# Patient Record
Sex: Female | Born: 1960 | Race: Black or African American | Hispanic: No | Marital: Single | Smoking: Never smoker
Health system: Southern US, Community
[De-identification: ages and names within clinical notes are randomized; demographics above are authoritative.]

## PROBLEM LIST (undated history)

## (undated) DIAGNOSIS — G35D Multiple sclerosis, unspecified: Secondary | ICD-10-CM

## (undated) DIAGNOSIS — I10 Essential (primary) hypertension: Secondary | ICD-10-CM

## (undated) DIAGNOSIS — G35 Multiple sclerosis: Secondary | ICD-10-CM

## (undated) HISTORY — DX: Multiple sclerosis: G35

## (undated) HISTORY — DX: Multiple sclerosis, unspecified: G35.D

## (undated) HISTORY — DX: Essential (primary) hypertension: I10

## (undated) HISTORY — PX: CHOLECYSTECTOMY: SHX55

## (undated) HISTORY — PX: ABDOMINAL HYSTERECTOMY: SHX81

---

## 2021-09-30 ENCOUNTER — Emergency Department (HOSPITAL_BASED_OUTPATIENT_CLINIC_OR_DEPARTMENT_OTHER)
Admission: EM | Admit: 2021-09-30 | Discharge: 2021-09-30 | Disposition: A | Discharge status: Home / Self Care | Payer: Medicare Other | Attending: Emergency Medicine | Admitting: Emergency Medicine

## 2021-09-30 ENCOUNTER — Emergency Department (HOSPITAL_BASED_OUTPATIENT_CLINIC_OR_DEPARTMENT_OTHER): Payer: Medicare Other

## 2021-09-30 ENCOUNTER — Other Ambulatory Visit: Payer: Self-pay

## 2021-09-30 DIAGNOSIS — M79604 Pain in right leg: Secondary | ICD-10-CM

## 2021-09-30 DIAGNOSIS — M79661 Pain in right lower leg: Secondary | ICD-10-CM | POA: Diagnosis not present

## 2021-09-30 DIAGNOSIS — R1031 Right lower quadrant pain: Secondary | ICD-10-CM | POA: Diagnosis not present

## 2021-09-30 LAB — URINALYSIS, ROUTINE W REFLEX MICROSCOPIC
Bilirubin Urine: NEGATIVE
Glucose, UA: NEGATIVE mg/dL
Hgb urine dipstick: NEGATIVE
Ketones, ur: NEGATIVE mg/dL
Leukocytes,Ua: NEGATIVE
Nitrite: NEGATIVE
Protein, ur: NEGATIVE mg/dL
Specific Gravity, Urine: 1.022 (ref 1.005–1.030)
pH: 6 (ref 5.0–8.0)

## 2021-09-30 LAB — CBC WITH DIFFERENTIAL/PLATELET
Abs Immature Granulocytes: 0.03 10*3/uL (ref 0.00–0.07)
Basophils Absolute: 0.1 10*3/uL (ref 0.0–0.1)
Basophils Relative: 1 %
Eosinophils Absolute: 0.1 10*3/uL (ref 0.0–0.5)
Eosinophils Relative: 1 %
HCT: 42.9 % (ref 36.0–46.0)
Hemoglobin: 13.8 g/dL (ref 12.0–15.0)
Immature Granulocytes: 0 %
Lymphocytes Relative: 22 %
Lymphs Abs: 2.3 10*3/uL (ref 0.7–4.0)
MCH: 26.8 pg (ref 26.0–34.0)
MCHC: 32.2 g/dL (ref 30.0–36.0)
MCV: 83.5 fL (ref 80.0–100.0)
Monocytes Absolute: 1 10*3/uL (ref 0.1–1.0)
Monocytes Relative: 10 %
Neutro Abs: 7 10*3/uL (ref 1.7–7.7)
Neutrophils Relative %: 66 %
Platelets: 333 10*3/uL (ref 150–400)
RBC: 5.14 MIL/uL — ABNORMAL HIGH (ref 3.87–5.11)
RDW: 14.3 % (ref 11.5–15.5)
WBC: 10.5 10*3/uL (ref 4.0–10.5)
nRBC: 0 % (ref 0.0–0.2)

## 2021-09-30 LAB — COMPREHENSIVE METABOLIC PANEL
ALT: 9 U/L (ref 0–44)
AST: 14 U/L — ABNORMAL LOW (ref 15–41)
Albumin: 4.2 g/dL (ref 3.5–5.0)
Alkaline Phosphatase: 79 U/L (ref 38–126)
Anion gap: 10 (ref 5–15)
BUN: 17 mg/dL (ref 8–23)
CO2: 25 mmol/L (ref 22–32)
Calcium: 9.9 mg/dL (ref 8.9–10.3)
Chloride: 106 mmol/L (ref 98–111)
Creatinine, Ser: 0.89 mg/dL (ref 0.44–1.00)
GFR, Estimated: >60 mL/min (ref >60)
Glucose, Bld: 81 mg/dL (ref 70–99)
Potassium: 3.7 mmol/L (ref 3.5–5.1)
Sodium: 141 mmol/L (ref 135–145)
Total Bilirubin: 0.4 mg/dL (ref 0.3–1.2)
Total Protein: 7.2 g/dL (ref 6.5–8.1)

## 2021-09-30 LAB — LIPASE, BLOOD: Lipase: 11 U/L (ref 11–51)

## 2021-09-30 MED ORDER — KETOROLAC TROMETHAMINE 30 MG/ML IJ SOLN
30.0000 mg | Freq: Once | INTRAMUSCULAR | Status: AC
  Administered 2021-09-30: 30 mg via INTRAVENOUS
  Filled 2021-09-30: qty 1

## 2021-09-30 NOTE — ED Triage Notes (Signed)
Pt arrives to ED with c/o of lower abdominal pain x1 day and right sided groin pain x1 week. The groin pain is right sided, intermittent, and sharp. Advil 600mg  helps relieve this pain. The abdominal pain is constant and dull. She reports she is urinating more. Denies dysuria, hematuria. No N/V/D/Constipation. No vaginal bleeding or abnormal discharge.

## 2021-09-30 NOTE — Discharge Instructions (Addendum)
Overall suspect that your pain is muscular in nature.  Recommend 400 mg ibuprofen every 8 hours as needed for the next 5 days in addition to 1000 mg of Tylenol every 6 hours as needed for the next 5 days.  Follow-up with primary care doctor and continue to work with physical therapy.

## 2021-09-30 NOTE — ED Provider Notes (Signed)
[facility] EMERGENCY DEPT Provider Note   CSN: [id number] Arrival date & time: 09/30/21  1342     History Chief Complaint  Patient presents with   Abdominal Pain    Lauren Curry is a 60 y.o. female.   Abdominal Pain Pain location:  RLQ Pain quality: aching   Pain radiates to:  Does not radiate Pain severity:  Mild Onset quality:  Gradual Timing:  Intermittent Progression:  Waxing and waning Chronicity:  New Context comment:  Pain in the right groin, right lower abdomen last several days.  Recently restarted physical therapy.  History of MS. Relieved by:  Nothing Worsened by:  Nothing Associated symptoms: no chest pain, no chills, no cough, no dysuria, no fever, no hematuria, no shortness of breath, no sore throat and no vomiting       No past medical history on file.  There are no problems to display for this patient.    OB History   No obstetric history on file.     No family history on file.     Home Medications Prior to Admission medications   Not on File    Allergies    Patient has no known allergies.  Review of Systems   Review of Systems  Constitutional:  Negative for chills and fever.  HENT:  Negative for ear pain and sore throat.   Eyes:  Negative for pain and visual disturbance.  Respiratory:  Negative for cough and shortness of breath.   Cardiovascular:  Negative for chest pain and palpitations.  Gastrointestinal:  Positive for abdominal pain. Negative for vomiting.  Genitourinary:  Negative for dysuria and hematuria.  Musculoskeletal:  Positive for gait problem. Negative for arthralgias and back pain.  Skin:  Negative for color change and rash.  Neurological:  Negative for seizures and syncope.  All other systems reviewed and are negative.  Physical Exam Updated Vital Signs BP (!) 190/96   Pulse 64   Temp 98.1 F (36.7 C) (Oral)   Resp 18   Ht 5\' 5"  (1.651 m)   Wt (!) 142.9 kg   SpO2 100%   BMI 52.42 kg/m    Physical Exam Vitals and nursing note reviewed.  Constitutional:      General: She is not in acute distress.    Appearance: She is well-developed.  HENT:     Head: Normocephalic and atraumatic.  Eyes:     Conjunctiva/sclera: Conjunctivae normal.  Cardiovascular:     Rate and Rhythm: Normal rate and regular rhythm.     Pulses: Normal pulses.          Dorsalis pedis pulses are 2+ on the right side and 2+ on the left side.     Heart sounds: No murmur heard. Pulmonary:     Effort: Pulmonary effort is normal. No respiratory distress.     Breath sounds: Normal breath sounds.  Abdominal:     General: Abdomen is flat.     Palpations: Abdomen is soft.     Tenderness: There is abdominal tenderness in the right lower quadrant. There is no guarding. Negative signs include Murphy's sign.     Hernia: No hernia is present.  Musculoskeletal:     Cervical back: Neck supple.     Comments: Tenderness in the right hip flexor area, pain when extending at her right hip  Skin:    General: Skin is warm and dry.     Capillary Refill: Capillary refill takes less than 2 seconds.  Neurological:  General: No focal deficit present.     Mental Status: She is alert.    ED Results / Procedures / Treatments   Labs (all labs ordered are listed, but only abnormal results are displayed) Labs Reviewed  CBC WITH DIFFERENTIAL/PLATELET - Abnormal; Notable for the following components:      Result Value   RBC 5.14 (*)    All other components within normal limits  COMPREHENSIVE METABOLIC PANEL - Abnormal; Notable for the following components:   AST 14 (*)    All other components within normal limits  LIPASE, BLOOD  URINALYSIS, ROUTINE W REFLEX MICROSCOPIC    EKG None  Radiology CT Renal Stone Study  Result Date: 09/30/2021 CLINICAL DATA:  Lower abdominal pain, right-sided groin pain EXAM: CT ABDOMEN AND PELVIS WITHOUT CONTRAST TECHNIQUE: Multidetector CT imaging of the abdomen and pelvis was  performed following the standard protocol without IV contrast. COMPARISON:  None. FINDINGS: Lower chest: There is mild dependent subsegmental atelectasis in the right lung base. The imaged heart is unremarkable. Hepatobiliary: The gallbladder is surgically absent. The liver is unremarkable. There is no biliary ductal dilatation. Pancreas: The pancreas demonstrates diffuse fatty atrophy. The pancreatic head appears edematous with mild haziness in the surrounding fat. There is no peripancreatic fluid collection. There is no main pancreatic ductal dilatation or focal parenchymal lesion. Spleen: Unremarkable. Adrenals/Urinary Tract: The adrenals are unremarkable. The kidneys are unremarkable. There are no focal lesions or stones. There is no hydronephrosis or hydroureter. The bladder is unremarkable. Stomach/Bowel: The stomach is decompressed but grossly unremarkable. There is no evidence of bowel obstruction. There is no abnormal bowel wall thickening or inflammatory change. There are scattered colonic diverticula without evidence of acute diverticulitis. The appendix is normal. Vascular/Lymphatic: The abdominal aorta is nonaneurysmal. There is no pathologic lymphadenopathy in the abdomen or pelvis. Reproductive: The uterus is not identified, presumed surgically absent. There is no adnexal mass. Other: There is no ascites or free air. Musculoskeletal: There is no acute osseous abnormality or aggressive osseous lesion. There is mild multilevel degenerative change of the spine. IMPRESSION: 1. Findings above suspicious for mild acute interstitial pancreatitis. Correlate with symptoms and lab values. 2. No other acute findings in the abdomen or pelvis. Specifically, no renal stones or hydronephrosis. Electronically Signed   By: Lesia Hausen M.D.   On: 09/30/2021 16:25    Procedures Procedures   Medications Ordered in ED Medications  ketorolac (TORADOL) 30 MG/ML injection 30 mg (30 mg Intravenous Given 09/30/21 1729)     ED Course  I have reviewed the triage vital signs and the nursing notes.  Pertinent labs & imaging results that were available during my care of the patient were reviewed by me and considered in my medical decision making (see chart for details).    MDM Rules/Calculators/A&P                           Tarahji Ramthun is here with right lower abdominal pain, right groin pain.  History of MS.  Vital signs overall unremarkable.  Blood pressure mildly elevated.  Recently restarted physical therapy.  Having pain when she flexes at her right hip.  Denies any trauma or falls.  Lab work showed no significant anemia, electrolyte Abdelhai, kidney injury.  No leukocytosis.  Lipase is normal.  CT scan was done that was overall unremarkable.  Thought maybe there could be some pancreatitis but lipase is normal.  History and physical is not consistent with  pancreatitis as well.  She is not having any trouble eating or drinking.  She does not have a hernia.  No urinary tract infection.  Pain appears to be mostly reproducible especially with movement of the right hip.  No fracture of the hip is seen.  Recommend anti-inflammatories and Tylenol.  Recommend follow-up with primary care doctor.  Recommend continued work with physical therapy as well.  Neurovascular neuromuscularly intact otherwise.  This chart was dictated using voice recognition software.  Despite best efforts to proofread,  errors can occur which can change the documentation meaning.    Final Clinical Impression(s) / ED Diagnoses Final diagnoses:  Pain of right lower extremity    Rx / DC Orders ED Discharge Orders     None        Virgina Norfolk, DO 09/30/21 1756

## 2021-09-30 NOTE — ED Notes (Signed)
Patient verbalizes understanding of discharge instructions. Opportunity for questioning and answers were provided. Patient discharged from ED.  °

## 2021-11-05 ENCOUNTER — Ambulatory Visit: Payer: Medicare Other

## 2021-11-19 ENCOUNTER — Ambulatory Visit: Payer: Medicare Other | Attending: Neurology

## 2022-01-06 ENCOUNTER — Telehealth: Payer: Medicare Other | Admitting: Physician Assistant

## 2022-01-06 DIAGNOSIS — U071 COVID-19: Secondary | ICD-10-CM

## 2022-01-06 MED ORDER — BENZONATATE 100 MG PO CAPS: 100.0000 mg | ORAL_CAPSULE | Freq: Three times a day (TID) | ORAL | 0 refills | Status: DC | PRN

## 2022-01-06 NOTE — Patient Instructions (Signed)
°  [facility], thank you for joining Margaretann Loveless, PA-C for today's virtual visit.  While this provider is not your primary care provider (PCP), if your PCP is located in our provider database this encounter information will be shared with them immediately following your visit.  Consent: (Patient) Lauren Curry provided verbal consent for this virtual visit at the beginning of the encounter.  Current Medications:  Current Outpatient Medications:    benzonatate (TESSALON) 100 MG capsule, Take 1 capsule (100 mg total) by mouth 3 (three) times daily as needed., Disp: 30 capsule, Rfl: 0   Medications ordered in this encounter:  Meds ordered this encounter  Medications   benzonatate (TESSALON) 100 MG capsule    Sig: Take 1 capsule (100 mg total) by mouth 3 (three) times daily as needed.    Dispense:  30 capsule    Refill:  0    Order Specific Question:   Supervising Provider    Answer:   Hyacinth Meeker, BRIAN [[id number]]     *If you need refills on other medications prior to your next appointment, please contact your pharmacy*  Follow-Up: Call back or seek an in-person evaluation if the symptoms worsen or if the condition fails to improve as anticipated.  Other Instructions 10 Things You Can Do to Manage Your COVID-19 Symptoms at Home If you have possible or confirmed COVID-19 Stay home except to get medical care. Monitor your symptoms carefully. If your symptoms get worse, call your healthcare provider immediately. Get rest and stay hydrated. If you have a medical appointment, call the healthcare provider ahead of time and tell them that you have or may have COVID-19. For medical emergencies, call 911 and notify the dispatch personnel that you have or may have COVID-19. Cover your cough and sneezes with a tissue or use the inside of your elbow. Wash your hands often with soap and water for at least 20 seconds or clean your hands with an alcohol-based hand sanitizer that contains at  least 60% alcohol. As much as possible, stay in a specific room and away from other people in your home. Also, you should use a separate bathroom, if available. If you need to be around other people in or outside of the home, wear a mask. Avoid sharing personal items with other people in your household, like dishes, towels, and bedding. Clean all surfaces that are touched often, like counters, tabletops, and doorknobs. Use household cleaning sprays or wipes according to the label instructions. [email or web address] 07/06/2020 This information is not intended to replace advice given to you by your health care provider. Make sure you discuss any questions you have with your health care provider. Document Revised: 08/30/2021 Document Reviewed: 08/30/2021 Elsevier Patient Education  2022 [employer].    If you have been instructed to have an in-person evaluation today at a local Urgent Care facility, please use the link below. It will take you to a list of all of our available [facility], including address, phone number and hours of operation. Please do not delay care.  [facility]  If you or a family member do not have a primary care provider, use the link below to schedule a visit and establish care. When you choose a [facility] primary care physician or advanced practice provider, you gain a long-term partner in health. Find a Primary Care Provider  Learn more about [facility] in-office and virtual care options: [facility] - Get Care Now

## 2022-01-06 NOTE — Progress Notes (Signed)
Virtual Visit Consent   Lauren Curry, you are scheduled for a virtual visit with a [facility] provider today.     Just as with appointments in the office, your consent must be obtained to participate.  Your consent will be active for this visit and any virtual visit you may have with one of our providers in the next 365 days.     If you have a MyChart account, a copy of this consent can be sent to you electronically.  All virtual visits are billed to your insurance company just like a traditional visit in the office.    As this is a virtual visit, video technology does not allow for your provider to perform a traditional examination.  This may limit your provider's ability to fully assess your condition.  If your provider identifies any concerns that need to be evaluated in person or the need to arrange testing (such as labs, EKG, etc.), we will make arrangements to do so.     Although advances in technology are sophisticated, we cannot ensure that it will always work on either your end or our end.  If the connection with a video visit is poor, the visit may have to be switched to a telephone visit.  With either a video or telephone visit, we are not always able to ensure that we have a secure connection.     I need to obtain your verbal consent now.   Are you willing to proceed with your visit today?    Lauren Curry has provided verbal consent on 01/06/2022 for a virtual visit (video or telephone).   Margaretann Loveless, PA-C   Date: 01/06/2022 9:19 AM   Virtual Visit via Video Note   I, Margaretann Loveless, connected with  Lauren Curry  ([id number], 08-25-1961) on 01/06/22 at  9:15 AM EST by a video-enabled telemedicine application and verified that I am speaking with the correct person using two identifiers.   Location: Patient: Virtual Visit Location Patient: Home Provider: Virtual Visit Location Provider: Home Office   I discussed the limitations of evaluation and management  by telemedicine and the availability of in person appointments. The patient expressed understanding and agreed to proceed.    History of Present Illness: Lauren Curry is a 61 y.o. who identifies as a female who was assigned female at birth, and is being seen today for covid 69.  HPI: URI  This is a new problem. Episode onset: tested positive for Covid 19 yesterday; Symptoms started last Tuesday possibly, had cold symptoms. Then Saturday she noticed weakness and decided to take a Covid. The problem has been gradually worsening. There has been no fever. Associated symptoms include congestion, coughing (productive), headaches, nausea, rhinorrhea and a sore throat. Pertinent negatives include no diarrhea, ear pain, plugged ear sensation, sinus pain or vomiting. Associated symptoms comments: Weakness, fatigue, post nasal drainage. She has tried acetaminophen, increased fluids and sleep for the symptoms. The treatment provided no relief.     Problems: There are no problems to display for this patient.   Allergies: No Known Allergies Medications:  Current Outpatient Medications:    benzonatate (TESSALON) 100 MG capsule, Take 1 capsule (100 mg total) by mouth 3 (three) times daily as needed., Disp: 30 capsule, Rfl: 0  Observations/Objective: Patient is well-developed, well-nourished in no acute distress.  Resting comfortably at home.  Head is normocephalic, atraumatic.  No labored breathing.  Speech is clear and coherent with logical content.  Patient is alert and oriented  at baseline.    Assessment and Plan: 1. COVID-19 - benzonatate (TESSALON) 100 MG capsule; Take 1 capsule (100 mg total) by mouth 3 (three) times daily as needed.  Dispense: 30 capsule; Refill: 0  - Continue OTC symptomatic management of choice; on day 6 of symptoms - Will send OTC vitamins and supplement information through AVS - Tessalon prescribed - Patient enrolled in MyChart symptom monitoring - Push fluids - Rest  as needed - Discussed return precautions and when to seek in-person evaluation, sent via AVS as well   Follow Up Instructions: I discussed the assessment and treatment plan with the patient. The patient was provided an opportunity to ask questions and all were answered. The patient agreed with the plan and demonstrated an understanding of the instructions.  A copy of instructions were sent to the patient via MyChart unless otherwise noted below.    The patient was advised to call back or seek an in-person evaluation if the symptoms worsen or if the condition fails to improve as anticipated.  Time:  I spent 11 minutes with the patient via telehealth technology discussing the above problems/concerns.    Margaretann Loveless, PA-C

## 2022-02-12 NOTE — Progress Notes (Signed)
Cardiology Office Note:   Date:  02/13/2022  NAME:  Lauren Curry    MRN: [id number] DOB:  [birth year]   PCP:  Pcp, No  Cardiologist:  None  Electrophysiologist:  None   Referring MD: Mallory Shirk, MD   Chief Complaint  Patient presents with   Pre-op Exam         History of Present Illness:   Lauren Curry is a 61 y.o. female with a hx of HTN, obesity who is being seen today for the evaluation of preoperative assessment at the request of Enochs, Algis Downs, MD. she reports he will have gastric bypass surgery in [facility].  Surgeon as mentioned above.  She has no heart history.  Her father is Mr. Beverely Pace.  She has multiple sclerosis.  She also has morbid obesity with a BMI of 300 pounds.  She apparently is largely bedridden from what she tells me.  She does not do any activity.  She is wheelchair-bound.  She can get up to go to the bathroom and that is about it.  She reports no chest pain or trouble breathing.  She did have swelling in the past but has been taken off Lasix.  Medical history is significant for hypertension.  She has never had a heart attack or stroke.  Family history is significant for CAD in her father.  She is married.  She has 3 children.  Relocating back to the [place] area.  Big issue is inactivity.  EKG demonstrates sinus rhythm with nonspecific ST-T changes.  She has poor R wave progression which I suspect is related to morbid obesity.  Denies any symptoms but cannot complete 4 METS.  She is a never smoker.  She does not alcohol.  No drug use is reported.  T chol 210, HDL 64, LDL 133, TG 75 A1c 5.6  Past Medical History: Past Medical History:  Diagnosis Date   Hypertension    Multiple sclerosis (HCC)     Past Surgical History: Past Surgical History:  Procedure Laterality Date   ABDOMINAL HYSTERECTOMY     CHOLECYSTECTOMY      Current Medications: Current Meds  Medication Sig   aspirin 81 MG EC tablet Take 1 tablet by mouth daily.   KESIMPTA  20 MG/0.4ML SOAJ Inject into the skin every 30 (thirty) days.   triamterene-hydrochlorothiazide (DYAZIDE) 37.5-25 MG capsule Take 1 capsule by mouth daily.   [DISCONTINUED] benzonatate (TESSALON) 100 MG capsule Take 1 capsule (100 mg total) by mouth 3 (three) times daily as needed.     Allergies:    Patient has no known allergies.   Social History: Social History   Socioeconomic History   Marital status: Single    Spouse name: Not on file   Number of children: 3   Years of education: Not on file   Highest education level: Not on file  Occupational History   Occupation: Disabled  Tobacco Use   Smoking status: Never   Smokeless tobacco: Never  Substance and Sexual Activity   Alcohol use: Never   Drug use: Never   Sexual activity: Never  Other Topics Concern   Not on file  Social History Narrative   Not on file   Social Determinants of Health   Financial Resource Strain: Not on file  Food Insecurity: Not on file  Transportation Needs: Not on file  Physical Activity: Not on file  Stress: Not on file  Social Connections: Not on file     Family History: The  patient's family history includes CAD in her father.  ROS:   All other ROS reviewed and negative. Pertinent positives noted in the HPI.     EKGs/Labs/Other Studies Reviewed:   The following studies were personally reviewed by me today:  EKG:  EKG is ordered today.  The ekg ordered today demonstrates normal sinus rhythm heart rate 90, nonspecific ST-T changes, and was personally reviewed by me.   Recent Labs: 09/30/2021: ALT 9; BUN 17; Creatinine, Ser 0.89; Hemoglobin 13.8; Platelets 333; Potassium 3.7; Sodium 141   Recent Lipid Panel No results found for: CHOL, TRIG, HDL, CHOLHDL, VLDL, LDLCALC, LDLDIRECT  Physical Exam:   VS:  BP 132/88 (BP Location: Right Arm, Patient Position: Sitting, Cuff Size: Large)    Pulse 90    Ht 5\' 3"  (1.6 m)    Wt 300 lb (136.1 kg)    BMI 53.14 kg/m    Wt Readings from Last 3  Encounters:  02/13/22 300 lb (136.1 kg)  09/30/21 (!) 315 lb (142.9 kg)    General: Well nourished, well developed, in no acute distress Head: Atraumatic, normal size  Eyes: PEERLA, EOMI  Neck: Supple, no JVD Endocrine: No thryomegaly Cardiac: Normal S1, S2; RRR; no murmurs, rubs, or gallops Lungs: Clear to auscultation bilaterally, no wheezing, rhonchi or rales  Abd: Soft, nontender, no hepatomegaly  Ext: No edema, pulses 2+ Musculoskeletal: No deformities, BUE and BLE strength normal and equal Skin: Warm and dry, no rashes   Neuro: Alert and oriented to person, place, time, and situation, CNII-XII grossly intact, no focal deficits  Psych: Normal mood and affect   ASSESSMENT:   Lauren Curry is a 61 y.o. female who presents for the following: 1. Preop cardiovascular exam   2. Obesity, morbid, BMI 50 or higher (HCC)     PLAN:   1. Preop cardiovascular exam 2. Obesity, morbid, BMI 50 or higher (HCC) -Preoperative assessment for gastric bypass surgery.  EKG demonstrates sinus rhythm with no acute ischemic changes.  She does have poor R wave progression likely due to obesity.  Her RCRI equals 0 which equates to a 0.4% risk of major perioperative cardiovascular complication.  She does have multiple sclerosis and is basically wheelchair-bound.  She cannot complete greater than 4 METS.  Despite this she does not report chest pain or trouble breathing.  Given that she cannot complete greater than 4 METS and her father has CAD I think it is reasonable to proceed with a Lexiscan nuclear medicine stress test.  This will exclude CAD.  I think this will be the best option to safely say she is okay for surgery.  I will see her back as needed based on the results of her scan.  Shared Decision Making/Informed Consent The risks [chest pain, shortness of breath, cardiac arrhythmias, dizziness, blood pressure fluctuations, myocardial infarction, stroke/transient ischemic attack, nausea, vomiting,  allergic reaction, radiation exposure, metallic taste sensation and life-threatening complications (estimated to be 1 in 10,000)], benefits (risk stratification, diagnosing coronary artery disease, treatment guidance) and alternatives of a nuclear stress test were discussed in detail with Lauren Curry and she agrees to proceed.  Disposition: Return if symptoms worsen or fail to improve.  Medication Adjustments/Labs and Tests Ordered: Current medicines are reviewed at length with the patient today.  Concerns regarding medicines are outlined above.  Orders Placed This Encounter  Procedures   Cardiac Stress Test: Informed Consent Details: Physician/Practitioner Attestation; Transcribe to consent form and obtain patient signature   MYOCARDIAL PERFUSION IMAGING  EKG 12-Lead   No orders of the defined types were placed in this encounter.   Patient Instructions  Medication Instructions:  The current medical regimen is effective;  continue present plan and medications.  *If you need a refill on your cardiac medications before your next appointment, please call your pharmacy*  Testing/Procedures: Your physician has requested that you have a lexiscan myoview. For further information please visit [email or web address] Please follow instruction sheet, as given.   Follow-Up: At [facility], you and your health needs are our priority.  As part of our continuing mission to provide you with exceptional heart care, we have created designated Provider Care Teams.  These Care Teams include your primary Cardiologist (physician) and Advanced Practice Providers (APPs -  Physician Assistants and Nurse Practitioners) who all work together to provide you with the care you need, when you need it.  We recommend signing up for the patient portal called "MyChart".  Sign up information is provided on this After Visit Summary.  MyChart is used to connect with patients for Virtual Visits (Telemedicine).  Patients are  able to view lab/test results, encounter notes, upcoming appointments, etc.  Non-urgent messages can be sent to your provider as well.   To learn more about what you can do with MyChart, go to [email or web address].    Your next appointment:   As needed  The format for your next appointment:   In Person  Provider:   Lennie Odor, MD      Signed, Lenna Gilford. Flora Lipps, MD, [facility]   [facility]  [address] [place], [place] [address] [phone]  02/13/2022 2:35 PM

## 2022-02-13 ENCOUNTER — Encounter: Payer: Self-pay | Admitting: Cardiovascular Disease

## 2022-02-13 ENCOUNTER — Other Ambulatory Visit: Payer: Self-pay

## 2022-02-13 ENCOUNTER — Ambulatory Visit (INDEPENDENT_AMBULATORY_CARE_PROVIDER_SITE_OTHER): Payer: Medicare Other | Admitting: Cardiovascular Disease

## 2022-02-13 VITALS — BP 132/88 | HR 90 | Ht 63.0 in | Wt 300.0 lb

## 2022-02-13 DIAGNOSIS — Z01810 Encounter for preprocedural cardiovascular examination: Secondary | ICD-10-CM | POA: Diagnosis not present

## 2022-02-13 NOTE — Patient Instructions (Signed)
Medication Instructions:  The current medical regimen is effective;  continue present plan and medications.  *If you need a refill on your cardiac medications before your next appointment, please call your pharmacy*  Testing/Procedures: Your physician has requested that you have a lexiscan myoview. For further information please visit [email or web address] Please follow instruction sheet, as given.   Follow-Up: At [facility], you and your health needs are our priority.  As part of our continuing mission to provide you with exceptional heart care, we have created designated Provider Care Teams.  These Care Teams include your primary Cardiologist (physician) and Advanced Practice Providers (APPs -  Physician Assistants and Nurse Practitioners) who all work together to provide you with the care you need, when you need it.  We recommend signing up for the patient portal called "MyChart".  Sign up information is provided on this After Visit Summary.  MyChart is used to connect with patients for Virtual Visits (Telemedicine).  Patients are able to view lab/test results, encounter notes, upcoming appointments, etc.  Non-urgent messages can be sent to your provider as well.   To learn more about what you can do with MyChart, go to [email or web address].    Your next appointment:   As needed  The format for your next appointment:   In Person  Provider:   Lennie Odor, MD

## 2022-02-14 ENCOUNTER — Telehealth (HOSPITAL_COMMUNITY): Payer: Self-pay | Admitting: *Deleted

## 2022-02-14 NOTE — Telephone Encounter (Signed)
Left message on voicemail per DPR in reference to upcoming appointment scheduled on 02/19/2022 at 10:30 with detailed instructions given per Myocardial Perfusion Study Information Sheet for the test. LM to arrive 15 minutes early, and that it is imperative to arrive on time for appointment to keep from having the test rescheduled. If you need to cancel or reschedule your appointment, please call the office within 24 hours of your appointment. Failure to do so may result in a cancellation of your appointment, and a $50 no show fee. Phone number given for call back for any questions.

## 2022-02-19 ENCOUNTER — Ambulatory Visit (HOSPITAL_COMMUNITY): Payer: Medicare Other | Attending: Cardiovascular Disease

## 2022-02-19 ENCOUNTER — Other Ambulatory Visit: Payer: Self-pay

## 2022-02-19 DIAGNOSIS — Z01810 Encounter for preprocedural cardiovascular examination: Secondary | ICD-10-CM | POA: Diagnosis present

## 2022-02-19 MED ORDER — TECHNETIUM TC 99M TETROFOSMIN IV KIT
32.4000 | PACK | Freq: Once | INTRAVENOUS | Status: AC | PRN
Start: 2022-02-19 — End: 2022-02-19
  Administered 2022-02-19: 32.4 via INTRAVENOUS
  Filled 2022-02-19: qty 33

## 2022-02-19 MED ORDER — REGADENOSON 0.4 MG/5ML IV SOLN
0.4000 mg | Freq: Once | INTRAVENOUS | Status: AC
  Administered 2022-02-19: 0.4 mg via INTRAVENOUS

## 2022-02-26 ENCOUNTER — Other Ambulatory Visit: Payer: Self-pay

## 2022-02-26 ENCOUNTER — Ambulatory Visit (HOSPITAL_COMMUNITY): Payer: Medicare Other | Attending: Internal Medicine

## 2022-02-26 LAB — MYOCARDIAL PERFUSION IMAGING
LV dias vol: 53 mL (ref 46–106)
LV sys vol: 16 mL
Nuc Stress EF: 70 %
Peak HR: 107 {beats}/min
Rest HR: 75 {beats}/min
Rest Nuclear Isotope Dose: 30.5 mCi
SDS: 2
SRS: 1
SSS: 3
ST Depression (mm): 0 mm
Stress Nuclear Isotope Dose: 32.4 mCi
TID: 1.35

## 2022-02-26 MED ORDER — TECHNETIUM TC 99M TETROFOSMIN IV KIT
30.5000 | PACK | Freq: Once | INTRAVENOUS | Status: AC | PRN
  Administered 2022-02-26: 30.5 via INTRAVENOUS
  Filled 2022-02-26: qty 31

## 2022-02-27 ENCOUNTER — Encounter: Payer: Self-pay | Admitting: Cardiovascular Disease

## 2022-09-26 ENCOUNTER — Other Ambulatory Visit (HOSPITAL_COMMUNITY): Payer: Self-pay | Admitting: Physician Assistant

## 2022-09-26 DIAGNOSIS — G35 Multiple sclerosis: Secondary | ICD-10-CM

## 2022-10-05 ENCOUNTER — Ambulatory Visit (HOSPITAL_COMMUNITY)
Admission: RE | Admit: 2022-10-05 | Discharge: 2022-10-05 | Disposition: A | Discharge status: Home / Self Care | Payer: Medicare Other | Source: Ambulatory Visit | Attending: Physician Assistant | Admitting: Physician Assistant

## 2022-10-05 DIAGNOSIS — G35 Multiple sclerosis: Secondary | ICD-10-CM

## 2022-10-05 MED ORDER — GADOBUTROL 1 MMOL/ML IV SOLN
10.0000 mL | Freq: Once | INTRAVENOUS | Status: AC | PRN
  Administered 2022-10-05: 10 mL via INTRAVENOUS

## 2023-07-13 ENCOUNTER — Ambulatory Visit: Payer: Medicare Other | Admitting: Physical Therapy

## 2023-07-13 ENCOUNTER — Telehealth: Payer: Medicare Other | Admitting: Nurse Practitioner

## 2023-07-13 DIAGNOSIS — U071 COVID-19: Secondary | ICD-10-CM

## 2023-07-13 MED ORDER — AZITHROMYCIN 250 MG PO TABS: ORAL_TABLET | ORAL | 0 refills | Status: AC

## 2023-07-13 MED ORDER — BENZONATATE 100 MG PO CAPS: 100.0000 mg | ORAL_CAPSULE | Freq: Three times a day (TID) | ORAL | 0 refills | Status: DC | PRN

## 2023-07-13 NOTE — Progress Notes (Signed)
Virtual Visit Consent   Lauren Curry, you are scheduled for a virtual visit with a [facility] provider today. Just as with appointments in the office, your consent must be obtained to participate. Your consent will be active for this visit and any virtual visit you may have with one of our providers in the next 365 days. If you have a MyChart account, a copy of this consent can be sent to you electronically.  As this is a virtual visit, video technology does not allow for your provider to perform a traditional examination. This may limit your provider's ability to fully assess your condition. If your provider identifies any concerns that need to be evaluated in person or the need to arrange testing (such as labs, EKG, etc.), we will make arrangements to do so. Although advances in technology are sophisticated, we cannot ensure that it will always work on either your end or our end. If the connection with a video visit is poor, the visit may have to be switched to a telephone visit. With either a video or telephone visit, we are not always able to ensure that we have a secure connection.  By engaging in this virtual visit, you consent to the provision of healthcare and authorize for your insurance to be billed (if applicable) for the services provided during this visit. Depending on your insurance coverage, you may receive a charge related to this service.  I need to obtain your verbal consent now. Are you willing to proceed with your visit today? Lauren Curry has provided verbal consent on 07/13/2023 for a virtual visit (video or telephone). Lauren Simas, FNP  Date: 07/13/2023 8:07 AM  Virtual Visit via Video Note   I, Lauren Curry, connected with  Lauren Curry  ([id number], 1961-07-13) on 07/13/23 at  8:15 AM EDT by a video-enabled telemedicine application and verified that I am speaking with the correct person using two identifiers.  Location: Patient: Virtual Visit Location Patient:  Home Provider: Virtual Visit Location Provider: Home Office   I discussed the limitations of evaluation and management by telemedicine and the availability of in person appointments. The patient expressed understanding and agreed to proceed.    History of Present Illness: Lauren Curry is a 62 y.o. 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   Allergies: No Known Allergies Medications:  Current Outpatient Medications:    aspirin 81 MG EC tablet, Take 1 tablet by mouth daily., Disp: , Rfl:    KESIMPTA 20 MG/0.4ML SOAJ, Inject into the skin every 30 (thirty) days., Disp: , Rfl:    triamterene-hydrochlorothiazide (DYAZIDE) 37.5-25 MG capsule, Take 1 capsule by mouth daily., Disp: , Rfl:   Observations/Objective: Patient is well-developed, well-nourished in no acute distress.  Resting comfortably  at home.  Head is normocephalic, atraumatic.  No labored breathing.  Speech is clear and coherent with logical content.  Patient is alert and oriented at baseline.    Assessment and Plan: 1. COVID-19  - azithromycin (ZITHROMAX) 250 MG tablet; Take 2 tablets on day 1, then 1 tablet daily on days 2 through 5  Dispense: 6 tablet; Refill: 0 - benzonatate (TESSALON) 100 MG capsule; Take 1 capsule (100 mg total) by mouth 3 (three) times daily as needed.   Dispense: 30 capsule; Refill: 0     Follow Up Instructions: I discussed the assessment and treatment plan with the patient. The patient was provided an opportunity to ask questions and all were answered. The patient agreed with the plan and demonstrated an understanding of the instructions.  A copy of instructions were sent to the patient via MyChart unless otherwise noted below.    The patient was advised to call back or seek an in-person evaluation if the symptoms worsen or if the condition fails to improve as anticipated.  Time:  I spent 14 minutes with the patient via telehealth technology discussing the above problems/concerns.    Lauren Simas, FNP

## 2023-07-28 ENCOUNTER — Ambulatory Visit: Payer: Medicare Other | Attending: Neurology | Admitting: Physical Therapy

## 2023-07-28 DIAGNOSIS — M6281 Muscle weakness (generalized): Secondary | ICD-10-CM | POA: Diagnosis present

## 2023-07-28 DIAGNOSIS — R2689 Other abnormalities of gait and mobility: Secondary | ICD-10-CM | POA: Insufficient documentation

## 2023-07-28 DIAGNOSIS — R2681 Unsteadiness on feet: Secondary | ICD-10-CM | POA: Insufficient documentation

## 2023-07-28 NOTE — Therapy (Signed)
OUTPATIENT PHYSICAL THERAPY NEURO EVALUATION   Patient Name: Lauren Curry MRN: [id number] DOB:October 26, 1961, 62 y.o., female Today's Date: 07/28/2023   PCP: None - moved from [place]  REFERRING PROVIDER: Mal Amabile, MD  END OF SESSION:  PT End of Session - 07/28/23 1109     Visit Number 1    Number of Visits 17    Date for PT Re-Evaluation 10/06/23    Authorization Type Medicare    PT Start Time 1107   Therapist running behind   PT Stop Time 1148    PT Time Calculation (min) 41 min    Equipment Utilized During Treatment Gait belt    Activity Tolerance Patient tolerated treatment well    Behavior During Therapy WFL for tasks assessed/performed             Past Medical History:  Diagnosis Date   Hypertension    Multiple sclerosis (HCC)    Past Surgical History:  Procedure Laterality Date   ABDOMINAL HYSTERECTOMY     CHOLECYSTECTOMY     There are no problems to display for this patient.   ONSET DATE: 06/05/2023 (referral)   REFERRING DIAG: G35 (ICD-10-CM) - Multiple sclerosis  THERAPY DIAG:  Muscle weakness (generalized)  Other abnormalities of gait and mobility  Unsteadiness on feet  Rationale for Evaluation and Treatment: Rehabilitation  SUBJECTIVE:                                                                                                                                                                                             SUBJECTIVE STATEMENT: Pt presents in Portable N5513A [place] Chair. Pt reports she was diagnosed w/MS in 2018 and started a weight loss journey because she was unable to move well, was ultimately bed bound. Did have HHPT services in 2022 but did not feel like she benefited from it due to living with her parents. Pt reports it has been about a year since she has walked with a RW. Primarily performs lateral transfers at home without a slide board. When she was using RW, she was falling frequently. Most recent fall was in  December right after her father passed, was transferring to the bed and her leg gave out on her. Now she wants to improve her QOL and find her "new normal"    Pt accompanied by:  Fredirick Maudlin   PERTINENT HISTORY: SPMS diagnosed in 2018  PAIN:  Are you having pain? No Uses a massage gun to manage her spasms in her legs   PRECAUTIONS: Fall  RED FLAGS: Bowel or bladder incontinence: Yes: Pt reports incontinence when her monthly MS injection  is due     WEIGHT BEARING RESTRICTIONS: No  FALLS: Has patient fallen in last 6 months? Yes. Number of falls 1  LIVING ENVIRONMENT: Lives with: lives with their family Lives in: House/apartment Stairs: No Has following equipment at home: Environmental consultant - 2 wheeled, Wheelchair (power), bed side commode, and Ramped entry  PLOF: Requires assistive device for independence, Needs assistance with ADLs, Needs assistance with homemaking, Needs assistance with gait, and Needs assistance with transfers  PATIENT GOALS: "To try to get some more movement in this left leg and maybe standing more"   OBJECTIVE:   DIAGNOSTIC FINDINGS: MRI: 10/05/2022 - MRI brain: Scattered mild T2/FLAIR hyperintensities within the subcortical and periventricular white matter, which are nonspecific but could represent the sequela of chronic demyelination given the reported clinical history. No enhancement to suggest active demyelination. No priors available to assess for new lesions. No evidence of acute intracranial abnormality. - MRI cervical spine: Ill-defined subtle T2 hyperintense signal within the lower cervical cord at the C6 and C7 levels with thinning of the cord at this location. No abnormal postcontrast enhancement. No additional sites of focal cord signal abnormality are evident. Findings are nonspecific and could be secondary to prior demyelination, ischemia, or trauma. No findings to suggest active demyelination. Cervical spondylosis at C5-C6 and C6-C7 with mild right foraminal  stenosis at C5-C6 and severe left and mild right foraminal stenosis at C6-C7. Mild canal stenosis at C6-C7. - MRI thoracic spine: No cord abnormality or evidence of demyelinating disease within the thoracic spine. Disc bulge with bilateral facet arthropathy at T10-T11 contributing to mild canal stenosis with moderate left and mild right foraminal stenosis.  COGNITION: Overall cognitive status: Within functional limits for tasks assessed   SENSATION: Pt reports numbness/tingling/burning in LLE   EDEMA: Chronic distal BLE swelling (LLE>RLE)   POSTURE: rounded shoulders and forward head   LOWER EXTREMITY MMT:  Tested in seated position   MMT Right Eval Left Eval  Hip flexion 2- 1  Hip extension    Hip abduction 3 2  Hip adduction 3 2  Hip internal rotation    Hip external rotation    Knee flexion 4- 0  Knee extension 3- 1  Ankle dorsiflexion 4- 0  Ankle plantarflexion    Ankle inversion    Ankle eversion    (Blank rows = not tested)  BED MOBILITY:  Pt reports she has rails on her bed and mostly can get in/out on her own unless her LLE is fatigued. Does have leg lifters at home to assist   TRANSFERS: Assistive device utilized: Walker - 2 wheeled  Sit to stand: Mod A x2 Stand to sit: Mod A x2   Performed single sit to stand from power chair to RW w/2 therapists. Due to arrangement of foot plate, pt unable to tuck her feet underneath her to obtain good leverage to stand, but pt able to shift anteriorly w/mod A x2 and push up to stand due to good BUE strength. Pt held stand for ~15s, obtaining full hip extension, prior to needing to sit. Mod Ax 2 for eccentric control.    GAIT: pt non-ambulatory at this time    TODAY'S TREATMENT:          Next Session  PATIENT EDUCATION: Education details: POC, eval findings, plan to trial Bioness next session and  need to wear shorts/dress to have access to quad.  Person educated: Patient and Sister, Elita Quick Education method: Explanation Education comprehension: verbalized understanding  HOME EXERCISE PROGRAM: To be established   GOALS: Goals reviewed with patient? Yes  SHORT TERM GOALS: Target date: 08/25/2023   Pt will perform initial HEP w/mod A from family for improved strength, transfers and gait.  Baseline: not established on eval  Goal status: INITIAL  2.  Pt will perform sit to stand w/mod A x1 w/LRAD for improved independence w/transfers and weightbearing tolerance  Baseline: mod A x2 w/RW Goal status: INITIAL  3.  Pt will trial use of Bioness in clinic for improved quad/anterior tibialis strength and facilitation of gait training.  Baseline:  Goal status: INITIAL  LONG TERM GOALS: Target date: 09/22/2023    Pt will perform final HEP w/min A from family for improved strength, balance, transfers and gait.  Baseline:  Goal status: INITIAL  2.  Pt will initiate gait training as able and goal to be updated as appropriate  Baseline:  Goal status: INITIAL  3.  Pt will perform sit to stand w/min A x1 and LRAD for improved functional strength and transfers.  Baseline: mod A x2 w/RW Goal status: INITIAL  ASSESSMENT:  CLINICAL IMPRESSION: Patient is a 62 year old female referred to Neuro OPPT for MS.   Pt's has no significant PMH on file. The following deficits were present during the exam: decreased strength, impaired sensation, decreased activity tolerance and impaired balance. Based on falls history and MS, pt is an incr risk for falls. Pt would benefit from skilled PT to address these impairments and functional limitations to maximize functional mobility independence.    OBJECTIVE IMPAIRMENTS: Abnormal gait, decreased activity tolerance, decreased balance, decreased coordination, decreased mobility, difficulty walking, decreased strength, increased muscle spasms, impaired  sensation, improper body mechanics, and pain.   ACTIVITY LIMITATIONS: carrying, lifting, standing, squatting, stairs, transfers, bed mobility, bathing, hygiene/grooming, locomotion level, and caring for others  PARTICIPATION LIMITATIONS: meal prep, cleaning, laundry, interpersonal relationship, driving, shopping, community activity, and yard work  PERSONAL FACTORS: Fitness, Past/current experiences, Transportation, and 1 comorbidity: SPMS  are also affecting patient's functional outcome.   REHAB POTENTIAL: Fair due to progressive nature of SPMS  CLINICAL DECISION MAKING: Evolving/moderate complexity  EVALUATION COMPLEXITY: Moderate  PLAN:  PT FREQUENCY: 2x/week  PT DURATION: 8 weeks (POC written for 10 weeks due to delay in scheduling)  PLANNED INTERVENTIONS: Therapeutic exercises, Therapeutic activity, Neuromuscular re-education, Balance training, Gait training, Patient/Family education, Self Care, Joint mobilization, Orthotic/Fit training, DME instructions, Aquatic Therapy, Dry Needling, Electrical stimulation, Manual therapy, and Re-evaluation  PLAN FOR NEXT SESSION: trial Bioness on L quads and anterior tib. Sit to stands in // bars or stedy. Gait training if able. Supine HEP    Ragan Reale E Aalayah Riles, PT 07/28/2023, 11:53 AM

## 2023-08-17 ENCOUNTER — Ambulatory Visit: Payer: Medicare Other | Admitting: Physical Therapy

## 2023-08-17 DIAGNOSIS — R2689 Other abnormalities of gait and mobility: Secondary | ICD-10-CM

## 2023-08-17 DIAGNOSIS — M6281 Muscle weakness (generalized): Secondary | ICD-10-CM

## 2023-08-17 DIAGNOSIS — R2681 Unsteadiness on feet: Secondary | ICD-10-CM

## 2023-08-17 NOTE — Therapy (Signed)
OUTPATIENT PHYSICAL THERAPY NEURO TREATMENT   Patient Name: Lauren Curry MRN: [id number] DOB:1961/11/29, 62 y.o., female Today's Date: 08/17/2023   PCP: None - moved from [place]  REFERRING PROVIDER: Mal Amabile, MD  END OF SESSION:  PT End of Session - 08/17/23 1106     Visit Number 2    Number of Visits 17    Date for PT Re-Evaluation 10/06/23    Authorization Type Medicare    PT Start Time 1103    PT Stop Time 1147    PT Time Calculation (min) 44 min    Equipment Utilized During Treatment Gait belt;Other (comment)   Bioness thigh and lower leg cuff   Activity Tolerance Patient tolerated treatment well    Behavior During Therapy WFL for tasks assessed/performed              Past Medical History:  Diagnosis Date   Hypertension    Multiple sclerosis (HCC)    Past Surgical History:  Procedure Laterality Date   ABDOMINAL HYSTERECTOMY     CHOLECYSTECTOMY     There are no problems to display for this patient.   ONSET DATE: 06/05/2023 (referral)   REFERRING DIAG: G35 (ICD-10-CM) - Multiple sclerosis  THERAPY DIAG:  Muscle weakness (generalized)  Other abnormalities of gait and mobility  Unsteadiness on feet  Rationale for Evaluation and Treatment: Rehabilitation  SUBJECTIVE:                                                                                                                                                                                             SUBJECTIVE STATEMENT: Pt presents in Portable N5513A [place] Chair. Pt reports doing well, denies changes since last visit. Got a new bed that elevates and massages, is happy with it. No falls or pain.   Pt accompanied by:  Significant other    PERTINENT HISTORY: SPMS diagnosed in 2018  PAIN:  Are you having pain? No Uses a massage gun to manage her spasms in her legs   PRECAUTIONS: Fall  RED FLAGS: Bowel or bladder incontinence: Yes: Pt reports incontinence when her monthly MS  injection is due     WEIGHT BEARING RESTRICTIONS: No  FALLS: Has patient fallen in last 6 months? Yes. Number of falls 1  LIVING ENVIRONMENT: Lives with: lives with their family Lives in: House/apartment Stairs: No Has following equipment at home: Dan Humphreys - 2 wheeled, Wheelchair (power), bed side commode, and Ramped entry  PLOF: Requires assistive device for independence, Needs assistance with ADLs, Needs assistance with homemaking, Needs assistance with gait, and Needs assistance with transfers  PATIENT GOALS: "To try to  get some more movement in this left leg and maybe standing more"   OBJECTIVE:   DIAGNOSTIC FINDINGS: MRI: 10/05/2022 - MRI brain: Scattered mild T2/FLAIR hyperintensities within the subcortical and periventricular white matter, which are nonspecific but could represent the sequela of chronic demyelination given the reported clinical history. No enhancement to suggest active demyelination. No priors available to assess for new lesions. No evidence of acute intracranial abnormality. - MRI cervical spine: Ill-defined subtle T2 hyperintense signal within the lower cervical cord at the C6 and C7 levels with thinning of the cord at this location. No abnormal postcontrast enhancement. No additional sites of focal cord signal abnormality are evident. Findings are nonspecific and could be secondary to prior demyelination, ischemia, or trauma. No findings to suggest active demyelination. Cervical spondylosis at C5-C6 and C6-C7 with mild right foraminal stenosis at C5-C6 and severe left and mild right foraminal stenosis at C6-C7. Mild canal stenosis at C6-C7. - MRI thoracic spine: No cord abnormality or evidence of demyelinating disease within the thoracic spine. Disc bulge with bilateral facet arthropathy at T10-T11 contributing to mild canal stenosis with moderate left and mild right foraminal stenosis.  COGNITION: Overall cognitive status: Within functional limits for tasks  assessed   SENSATION: Pt reports numbness/tingling/burning in LLE   EDEMA: Chronic distal BLE swelling (LLE>RLE)   POSTURE: rounded shoulders and forward head   LOWER EXTREMITY MMT:  Tested in seated position   MMT Right Eval Left Eval  Hip flexion 2- 1  Hip extension    Hip abduction 3 2  Hip adduction 3 2  Hip internal rotation    Hip external rotation    Knee flexion 4- 0  Knee extension 3- 1  Ankle dorsiflexion 4- 0  Ankle plantarflexion    Ankle inversion    Ankle eversion    (Blank rows = not tested)  BED MOBILITY:  Pt reports she has rails on her bed and mostly can get in/out on her own unless her LLE is fatigued. Does have leg lifters at home to assist   TODAY'S TREATMENT:     E-stim attended Set-up pt on Tablet 2 for lower leg cuff and thigh cuff (quads) for improved knee extension and ankle DF w/gait. See tablet 2 for details.   NMR   TRANSFERS: Assistive device utilized: Environmental consultant - 2 wheeled and Grab bars  Sit to stand: Mod A x2 Stand to sit: Mod A x2   Attempted to perform sit to stand from power chair to RW, but due to poor placement of the footplate, pt unable to place her feet beneath her to have leverage to stand. Attempted 4 sit to stands from chair w/RW, but pt unable to shift weight anteriorly. Transitioned to performing in front of ballet bars to provide more leverage, but pt very anxious to perform this way. Attempted 5 sit to stands, but pt unable to grab bars mid-transfer to pull to stand. Pt demonstrates good push-up from chair and is able to lift bottom completely off seat. Encouraged pt to bring her other WC next session in order to promote better foot placement w/transfers. Can also try the hi-lo table.    PATIENT EDUCATION: Education details: Bringing different WC to next session Person educated: Patient Education method: Explanation Education comprehension: verbalized understanding  HOME EXERCISE PROGRAM: To be established    GOALS: Goals reviewed with patient? Yes  SHORT TERM GOALS: Target date: 08/25/2023   Pt will perform initial HEP w/mod A from family for improved strength, transfers  and gait.  Baseline: not established on eval  Goal status: INITIAL  2.  Pt will perform sit to stand w/mod A x1 w/LRAD for improved independence w/transfers and weightbearing tolerance  Baseline: mod A x2 w/RW Goal status: INITIAL  3.  Pt will trial use of Bioness in clinic for improved quad/anterior tibialis strength and facilitation of gait training.  Baseline:  Goal status: INITIAL  LONG TERM GOALS: Target date: 09/22/2023    Pt will perform final HEP w/min A from family for improved strength, balance, transfers and gait.  Baseline:  Goal status: INITIAL  2.  Pt will initiate gait training as able and goal to be updated as appropriate  Baseline:  Goal status: INITIAL  3.  Pt will perform sit to stand w/min A x1 and LRAD for improved functional strength and transfers.  Baseline: mod A x2 w/RW Goal status: INITIAL  ASSESSMENT:  CLINICAL IMPRESSION: Emphasis of skilled PT session on setting up Bioness to L quad and L anterior tibialis as well as transfers. Pt demonstrates good response to Bioness but was unable to perform transfer from power chair as her footplate gets in the way of placing her feet under her body. Attempted >10 sit to stands from her power chair w/mod A x2 w/RW and ballet bar, but pt unable to shift weight anteriorly once bottom is lifted this date. Encouraged pt to bring her other chair next session. Continue POC.    OBJECTIVE IMPAIRMENTS: Abnormal gait, decreased activity tolerance, decreased balance, decreased coordination, decreased mobility, difficulty walking, decreased strength, increased muscle spasms, impaired sensation, improper body mechanics, and pain.   ACTIVITY LIMITATIONS: carrying, lifting, standing, squatting, stairs, transfers, bed mobility, bathing, hygiene/grooming,  locomotion level, and caring for others  PARTICIPATION LIMITATIONS: meal prep, cleaning, laundry, interpersonal relationship, driving, shopping, community activity, and yard work  PERSONAL FACTORS: Fitness, Past/current experiences, Transportation, and 1 comorbidity: SPMS  are also affecting patient's functional outcome.   REHAB POTENTIAL: Fair due to progressive nature of SPMS  CLINICAL DECISION MAKING: Evolving/moderate complexity  EVALUATION COMPLEXITY: Moderate  PLAN:  PT FREQUENCY: 2x/week  PT DURATION: 8 weeks (POC written for 10 weeks due to delay in scheduling)  PLANNED INTERVENTIONS: Therapeutic exercises, Therapeutic activity, Neuromuscular re-education, Balance training, Gait training, Patient/Family education, Self Care, Joint mobilization, Orthotic/Fit training, DME instructions, Aquatic Therapy, Dry Needling, Electrical stimulation, Manual therapy, and Re-evaluation  PLAN FOR NEXT SESSION: Use Bioness, did she bring other WC? Try sit to stands w/RW from new WC or from hi-lo. Gait training if able. Supine HEP    Tavaughn Silguero E Nathania Waldman, PT 08/17/2023, 11:59 AM

## 2023-08-20 ENCOUNTER — Ambulatory Visit: Payer: Medicare Other | Admitting: Physical Therapy

## 2023-08-20 ENCOUNTER — Telehealth: Payer: Self-pay | Admitting: Physical Therapy

## 2023-08-20 NOTE — Telephone Encounter (Signed)
Called pt regarding no-show appt. Pt reports that she called to cancel appt earlier that morning and reports that she had a bad MS night. Reminded pt of next scheduled appt. Pt verbalized understanding.  Sherlie Ban, PT, DPT 08/20/23 11:29 AM   [facility] [address] [place], [place]  [address] Phone:  [phone] Fax:  [phone]

## 2023-08-25 ENCOUNTER — Ambulatory Visit: Payer: Medicare Other | Admitting: Physical Therapy

## 2023-08-27 ENCOUNTER — Encounter: Payer: Self-pay | Admitting: Physical Therapy

## 2023-08-27 ENCOUNTER — Ambulatory Visit: Payer: Medicare Other | Admitting: Physical Therapy

## 2023-08-27 NOTE — Therapy (Signed)
[facility] [address] [place], [place], [address] Phone: [phone]   Fax:  [phone]  Patient Details  Name: Lauren Curry MRN: [id number] Date of Birth: 23-Oct-1961 Referring Provider:  No ref. provider found  Encounter Date: 08/27/2023  PHYSICAL THERAPY DISCHARGE SUMMARY  Visits from Start of Care: 2  Current functional level related to goals / functional outcomes: Pt requires power chair for mobility and is total A for transfers    Remaining deficits: High fall risk    Education / Equipment: None    Patient agrees to discharge. Patient goals were not met. Patient is being discharged due to  receiving home health therapies.  [name], PT 08/27/2023, 11:50 AM  [facility] [address] [place], [place], [address] Phone: [phone]   Fax:  [phone]

## 2023-08-31 ENCOUNTER — Ambulatory Visit: Payer: Medicare Other | Admitting: Physical Therapy

## 2023-09-02 ENCOUNTER — Ambulatory Visit: Payer: Medicare Other | Admitting: Physical Therapy

## 2023-09-07 ENCOUNTER — Ambulatory Visit: Payer: Medicare Other | Admitting: Physical Therapy

## 2023-09-09 ENCOUNTER — Ambulatory Visit: Payer: Medicare Other | Admitting: Physical Therapy

## 2023-09-14 ENCOUNTER — Ambulatory Visit: Payer: Medicare Other | Admitting: Physical Therapy

## 2023-09-16 ENCOUNTER — Ambulatory Visit: Payer: Medicare Other | Admitting: Physical Therapy

## 2023-09-21 ENCOUNTER — Ambulatory Visit: Payer: Medicare Other | Admitting: Physical Therapy

## 2023-09-23 ENCOUNTER — Other Ambulatory Visit (HOSPITAL_COMMUNITY): Payer: Self-pay | Admitting: Neurology

## 2023-09-23 ENCOUNTER — Ambulatory Visit: Payer: Medicare Other | Admitting: Physical Therapy

## 2023-09-23 DIAGNOSIS — G35 Multiple sclerosis: Secondary | ICD-10-CM

## 2023-09-28 ENCOUNTER — Ambulatory Visit: Payer: Medicare Other | Admitting: Physical Therapy

## 2023-09-30 ENCOUNTER — Ambulatory Visit: Payer: Medicare Other | Admitting: Physical Therapy

## 2023-10-01 IMAGING — CT CT RENAL STONE PROTOCOL
2 of 4 series · 16 of 46 positions shown, 18 images · non-contrast
Comparison: None.

CLINICAL DATA: Lower abdominal pain, right-sided groin pain

EXAM:
CT ABDOMEN AND PELVIS WITHOUT CONTRAST
TECHNIQUE: Multidetector CT imaging of the abdomen and pelvis was performed
following the standard protocol without IV contrast.

[Series 2: stone full · axial · 0.97mm/px · z∈[-182,+223]mm · 13 of 89 slices shown, 15 images]
[im 4/89  soft-tissue]
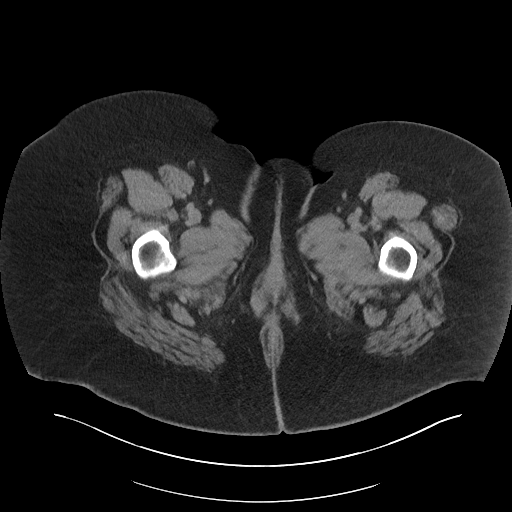
[im 4/89  bone]
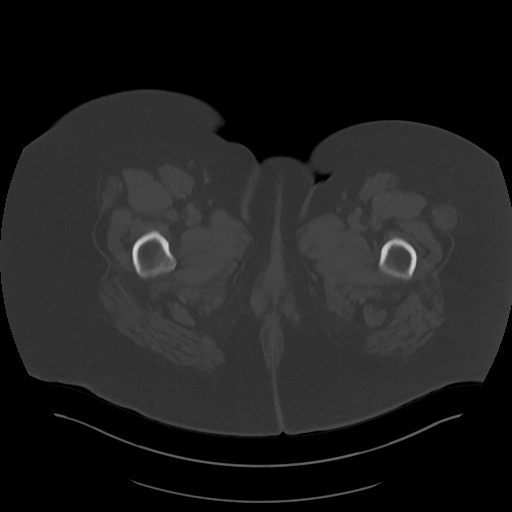
[im 11/89  soft-tissue]
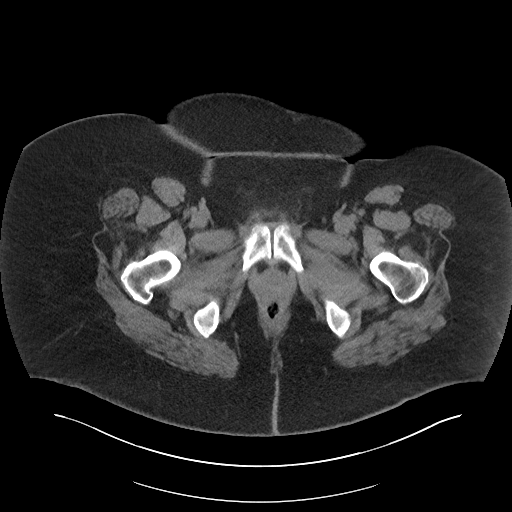
[im 17/89  soft-tissue]
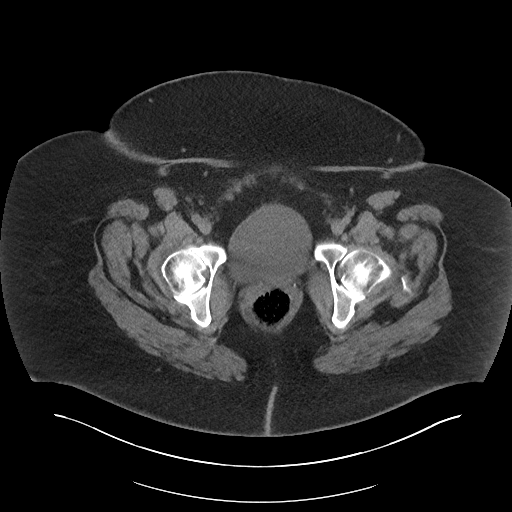
[im 24/89  soft-tissue]
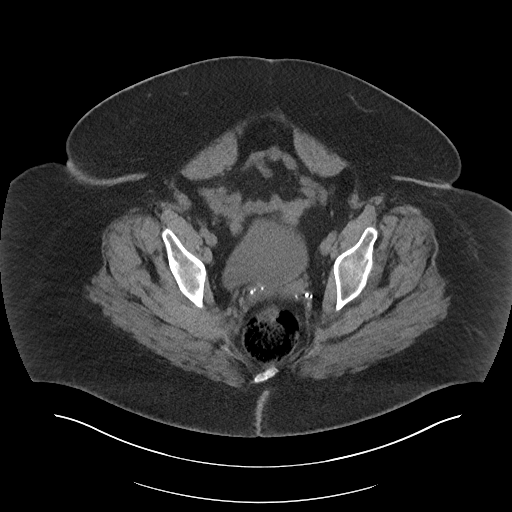
[im 31/89  soft-tissue]
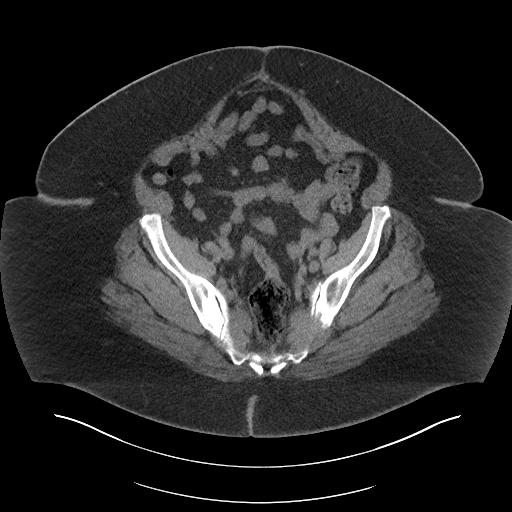
[im 38/89  soft-tissue]
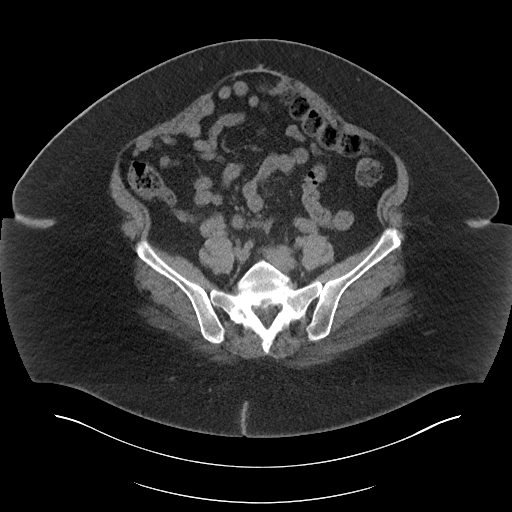
[im 45/89  soft-tissue]
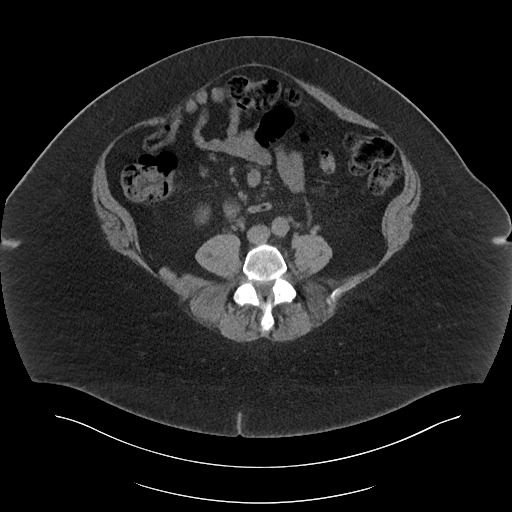
[im 51/89  soft-tissue]
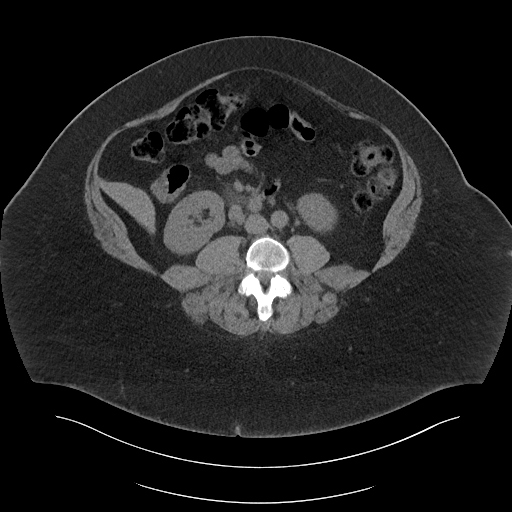
[im 58/89  soft-tissue]
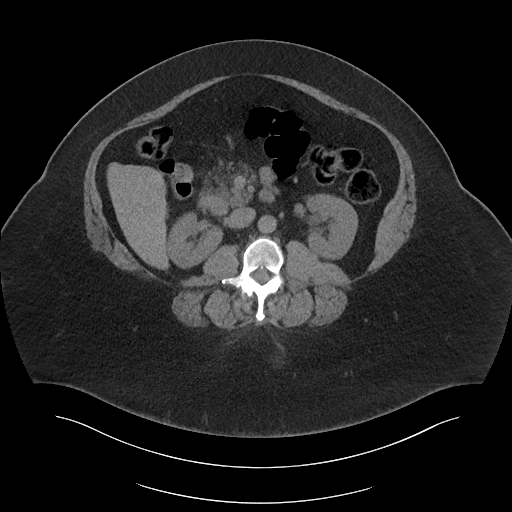
[im 58/89  bone]
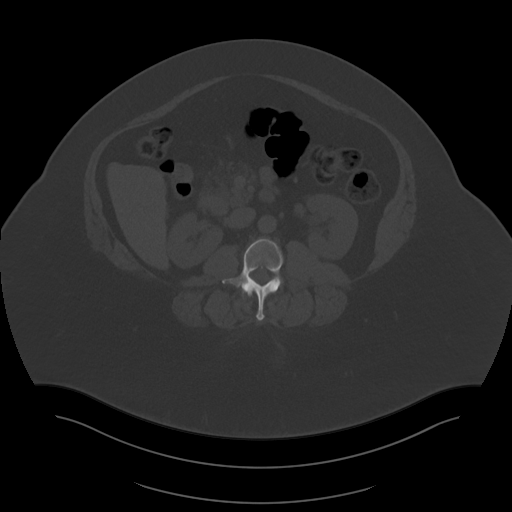
[im 65/89  soft-tissue]
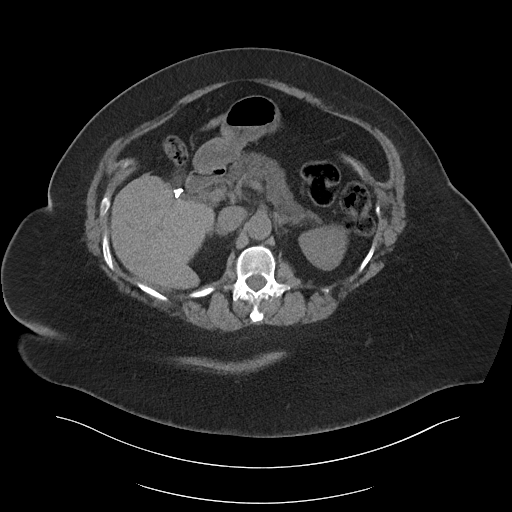
[im 72/89  soft-tissue]
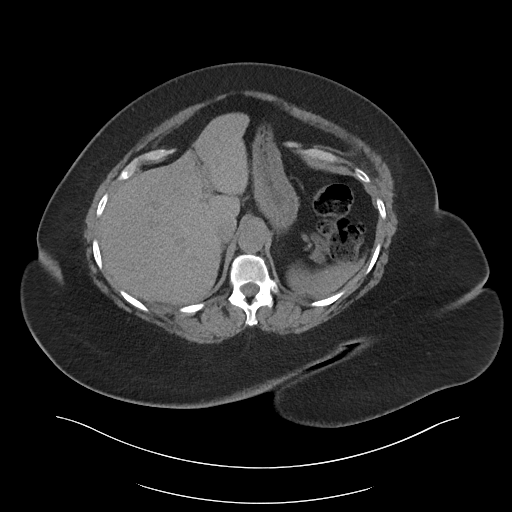
[im 78/89  soft-tissue]
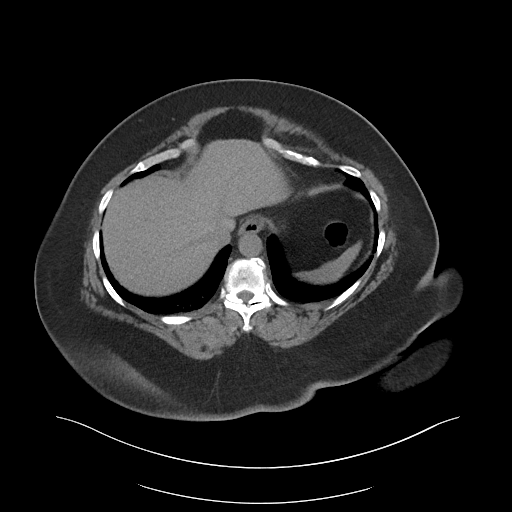
[im 85/89  soft-tissue]
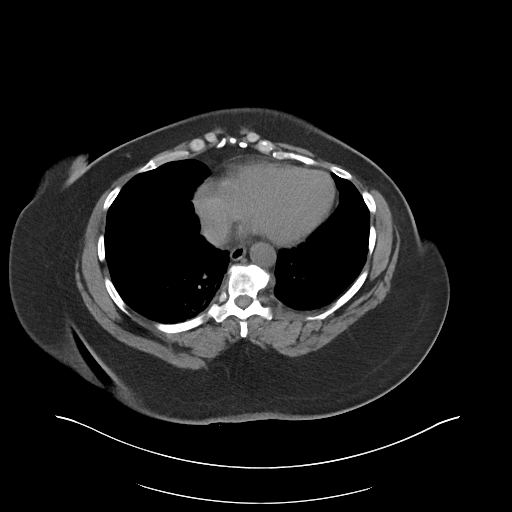

[Series 5: coronal · coronal · 0.90mm/px · 3 of 133 slices shown]
[im 45/133  soft-tissue]
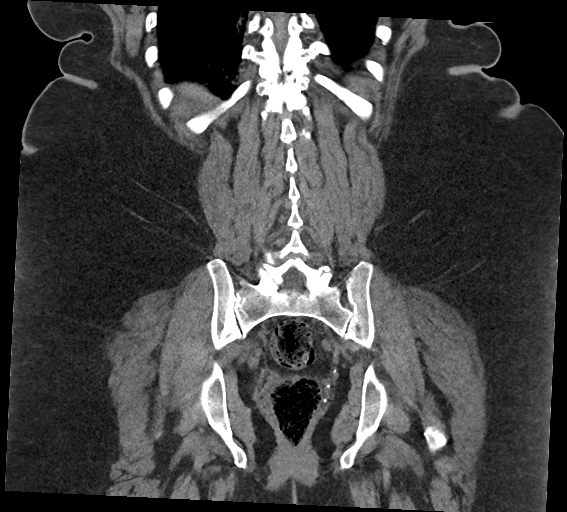
[im 59/133  soft-tissue]
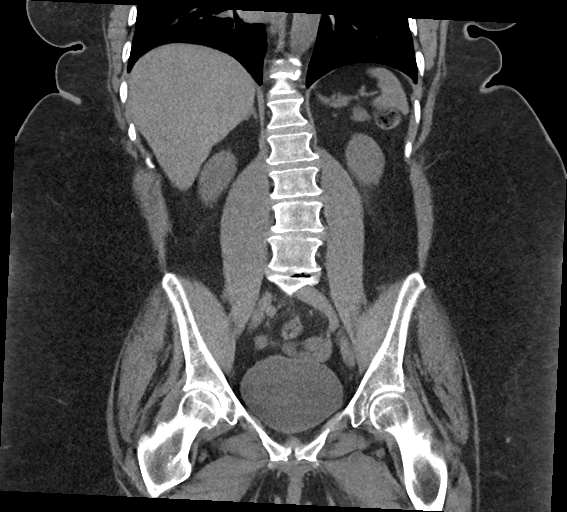
[im 74/133  soft-tissue]
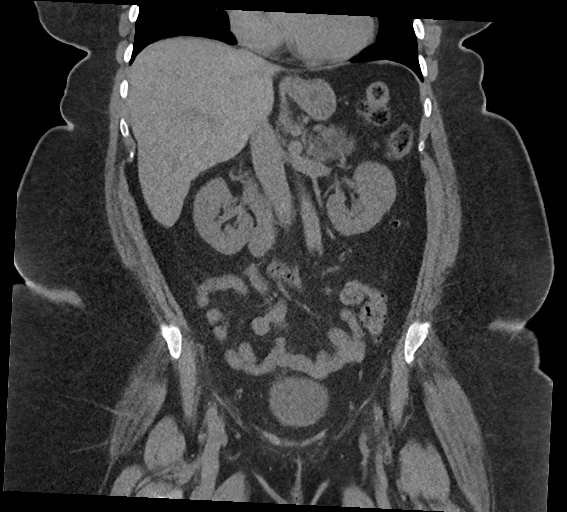

[16 of 46 positions shown; findings below may reference images not displayed]

FINDINGS: Lower chest: There is mild dependent subsegmental atelectasis in the
right lung base. The imaged heart is unremarkable.

Hepatobiliary: The gallbladder is surgically absent. The liver is
unremarkable. There is no biliary ductal dilatation.

Pancreas: The pancreas demonstrates diffuse fatty atrophy. The
pancreatic head appears edematous with mild haziness in the
surrounding fat. There is no peripancreatic fluid collection. There
is no main pancreatic ductal dilatation or focal parenchymal lesion.

Spleen: Unremarkable.

Adrenals/Urinary Tract: The adrenals are unremarkable.

The kidneys are unremarkable. There are no focal lesions or stones.
There is no hydronephrosis or hydroureter. The bladder is
unremarkable.

Stomach/Bowel: The stomach is decompressed but grossly unremarkable.
There is no evidence of bowel obstruction. There is no abnormal
bowel wall thickening or inflammatory change. There are scattered
colonic diverticula without evidence of acute diverticulitis. The
appendix is normal.

Vascular/Lymphatic: The abdominal aorta is nonaneurysmal. There is
no pathologic lymphadenopathy in the abdomen or pelvis.

Reproductive: The uterus is not identified, presumed surgically
absent. There is no adnexal mass.

Other: There is no ascites or free air.

Musculoskeletal: There is no acute osseous abnormality or aggressive
osseous lesion. There is mild multilevel degenerative change of the
spine.
IMPRESSION: 1. Findings above suspicious for mild acute interstitial
pancreatitis. Correlate with symptoms and lab values.
2. No other acute findings in the abdomen or pelvis. Specifically,
no renal stones or hydronephrosis.

## 2023-10-05 ENCOUNTER — Ambulatory Visit: Payer: Medicare Other | Admitting: Physical Therapy

## 2023-10-07 ENCOUNTER — Ambulatory Visit: Payer: Medicare Other | Admitting: Physical Therapy

## 2023-10-12 ENCOUNTER — Ambulatory Visit: Payer: Medicare Other | Admitting: Physical Therapy

## 2023-10-14 ENCOUNTER — Ambulatory Visit: Payer: Medicare Other | Admitting: Physical Therapy

## 2023-12-21 ENCOUNTER — Other Ambulatory Visit (HOSPITAL_COMMUNITY): Payer: Self-pay | Admitting: Neurology

## 2023-12-21 DIAGNOSIS — G35 Multiple sclerosis: Secondary | ICD-10-CM

## 2024-01-24 DIAGNOSIS — D849 Immunodeficiency, unspecified: Secondary | ICD-10-CM | POA: Diagnosis not present

## 2024-01-24 DIAGNOSIS — G8220 Paraplegia, unspecified: Secondary | ICD-10-CM | POA: Diagnosis not present

## 2024-01-24 DIAGNOSIS — G35 Multiple sclerosis: Secondary | ICD-10-CM | POA: Diagnosis not present

## 2024-01-24 DIAGNOSIS — Z87440 Personal history of urinary (tract) infections: Secondary | ICD-10-CM | POA: Diagnosis not present

## 2024-01-24 DIAGNOSIS — K219 Gastro-esophageal reflux disease without esophagitis: Secondary | ICD-10-CM | POA: Diagnosis not present

## 2024-01-24 DIAGNOSIS — E559 Vitamin D deficiency, unspecified: Secondary | ICD-10-CM | POA: Diagnosis not present

## 2024-01-24 DIAGNOSIS — G373 Acute transverse myelitis in demyelinating disease of central nervous system: Secondary | ICD-10-CM | POA: Diagnosis not present

## 2024-01-24 DIAGNOSIS — I10 Essential (primary) hypertension: Secondary | ICD-10-CM | POA: Diagnosis not present

## 2024-01-26 DIAGNOSIS — G373 Acute transverse myelitis in demyelinating disease of central nervous system: Secondary | ICD-10-CM | POA: Diagnosis not present

## 2024-01-26 DIAGNOSIS — I10 Essential (primary) hypertension: Secondary | ICD-10-CM | POA: Diagnosis not present

## 2024-01-26 DIAGNOSIS — K219 Gastro-esophageal reflux disease without esophagitis: Secondary | ICD-10-CM | POA: Diagnosis not present

## 2024-01-26 DIAGNOSIS — E559 Vitamin D deficiency, unspecified: Secondary | ICD-10-CM | POA: Diagnosis not present

## 2024-01-26 DIAGNOSIS — G35 Multiple sclerosis: Secondary | ICD-10-CM | POA: Diagnosis not present

## 2024-01-26 DIAGNOSIS — G8220 Paraplegia, unspecified: Secondary | ICD-10-CM | POA: Diagnosis not present

## 2024-01-26 DIAGNOSIS — Z87440 Personal history of urinary (tract) infections: Secondary | ICD-10-CM | POA: Diagnosis not present

## 2024-01-26 DIAGNOSIS — D849 Immunodeficiency, unspecified: Secondary | ICD-10-CM | POA: Diagnosis not present

## 2024-01-27 DIAGNOSIS — I10 Essential (primary) hypertension: Secondary | ICD-10-CM | POA: Diagnosis not present

## 2024-01-27 DIAGNOSIS — G35 Multiple sclerosis: Secondary | ICD-10-CM | POA: Diagnosis not present

## 2024-01-27 DIAGNOSIS — Z87440 Personal history of urinary (tract) infections: Secondary | ICD-10-CM | POA: Diagnosis not present

## 2024-01-27 DIAGNOSIS — G8220 Paraplegia, unspecified: Secondary | ICD-10-CM | POA: Diagnosis not present

## 2024-01-27 DIAGNOSIS — K219 Gastro-esophageal reflux disease without esophagitis: Secondary | ICD-10-CM | POA: Diagnosis not present

## 2024-01-27 DIAGNOSIS — E559 Vitamin D deficiency, unspecified: Secondary | ICD-10-CM | POA: Diagnosis not present

## 2024-01-27 DIAGNOSIS — G373 Acute transverse myelitis in demyelinating disease of central nervous system: Secondary | ICD-10-CM | POA: Diagnosis not present

## 2024-01-27 DIAGNOSIS — D849 Immunodeficiency, unspecified: Secondary | ICD-10-CM | POA: Diagnosis not present

## 2024-01-28 DIAGNOSIS — Z87440 Personal history of urinary (tract) infections: Secondary | ICD-10-CM | POA: Diagnosis not present

## 2024-02-03 DIAGNOSIS — G373 Acute transverse myelitis in demyelinating disease of central nervous system: Secondary | ICD-10-CM | POA: Diagnosis not present

## 2024-02-03 DIAGNOSIS — E559 Vitamin D deficiency, unspecified: Secondary | ICD-10-CM | POA: Diagnosis not present

## 2024-02-03 DIAGNOSIS — G8220 Paraplegia, unspecified: Secondary | ICD-10-CM | POA: Diagnosis not present

## 2024-02-03 DIAGNOSIS — K219 Gastro-esophageal reflux disease without esophagitis: Secondary | ICD-10-CM | POA: Diagnosis not present

## 2024-02-03 DIAGNOSIS — Z87440 Personal history of urinary (tract) infections: Secondary | ICD-10-CM | POA: Diagnosis not present

## 2024-02-03 DIAGNOSIS — D849 Immunodeficiency, unspecified: Secondary | ICD-10-CM | POA: Diagnosis not present

## 2024-02-03 DIAGNOSIS — I10 Essential (primary) hypertension: Secondary | ICD-10-CM | POA: Diagnosis not present

## 2024-02-03 DIAGNOSIS — G35 Multiple sclerosis: Secondary | ICD-10-CM | POA: Diagnosis not present

## 2024-02-05 DIAGNOSIS — I10 Essential (primary) hypertension: Secondary | ICD-10-CM | POA: Diagnosis not present

## 2024-02-05 DIAGNOSIS — K219 Gastro-esophageal reflux disease without esophagitis: Secondary | ICD-10-CM | POA: Diagnosis not present

## 2024-02-05 DIAGNOSIS — G35 Multiple sclerosis: Secondary | ICD-10-CM | POA: Diagnosis not present

## 2024-02-05 DIAGNOSIS — Z87440 Personal history of urinary (tract) infections: Secondary | ICD-10-CM | POA: Diagnosis not present

## 2024-02-05 DIAGNOSIS — D849 Immunodeficiency, unspecified: Secondary | ICD-10-CM | POA: Diagnosis not present

## 2024-02-05 DIAGNOSIS — G373 Acute transverse myelitis in demyelinating disease of central nervous system: Secondary | ICD-10-CM | POA: Diagnosis not present

## 2024-02-05 DIAGNOSIS — E559 Vitamin D deficiency, unspecified: Secondary | ICD-10-CM | POA: Diagnosis not present

## 2024-02-05 DIAGNOSIS — G8220 Paraplegia, unspecified: Secondary | ICD-10-CM | POA: Diagnosis not present

## 2024-02-08 DIAGNOSIS — K219 Gastro-esophageal reflux disease without esophagitis: Secondary | ICD-10-CM | POA: Diagnosis not present

## 2024-02-08 DIAGNOSIS — I10 Essential (primary) hypertension: Secondary | ICD-10-CM | POA: Diagnosis not present

## 2024-02-08 DIAGNOSIS — Z87440 Personal history of urinary (tract) infections: Secondary | ICD-10-CM | POA: Diagnosis not present

## 2024-02-08 DIAGNOSIS — G35 Multiple sclerosis: Secondary | ICD-10-CM | POA: Diagnosis not present

## 2024-02-08 DIAGNOSIS — D849 Immunodeficiency, unspecified: Secondary | ICD-10-CM | POA: Diagnosis not present

## 2024-02-08 DIAGNOSIS — G8220 Paraplegia, unspecified: Secondary | ICD-10-CM | POA: Diagnosis not present

## 2024-02-08 DIAGNOSIS — G373 Acute transverse myelitis in demyelinating disease of central nervous system: Secondary | ICD-10-CM | POA: Diagnosis not present

## 2024-02-08 DIAGNOSIS — E559 Vitamin D deficiency, unspecified: Secondary | ICD-10-CM | POA: Diagnosis not present

## 2024-02-09 DIAGNOSIS — I10 Essential (primary) hypertension: Secondary | ICD-10-CM | POA: Diagnosis not present

## 2024-02-09 DIAGNOSIS — K219 Gastro-esophageal reflux disease without esophagitis: Secondary | ICD-10-CM | POA: Diagnosis not present

## 2024-02-09 DIAGNOSIS — E559 Vitamin D deficiency, unspecified: Secondary | ICD-10-CM | POA: Diagnosis not present

## 2024-02-09 DIAGNOSIS — G373 Acute transverse myelitis in demyelinating disease of central nervous system: Secondary | ICD-10-CM | POA: Diagnosis not present

## 2024-02-09 DIAGNOSIS — D849 Immunodeficiency, unspecified: Secondary | ICD-10-CM | POA: Diagnosis not present

## 2024-02-09 DIAGNOSIS — G8220 Paraplegia, unspecified: Secondary | ICD-10-CM | POA: Diagnosis not present

## 2024-02-09 DIAGNOSIS — G35 Multiple sclerosis: Secondary | ICD-10-CM | POA: Diagnosis not present

## 2024-02-09 DIAGNOSIS — Z87440 Personal history of urinary (tract) infections: Secondary | ICD-10-CM | POA: Diagnosis not present

## 2024-02-12 DIAGNOSIS — D849 Immunodeficiency, unspecified: Secondary | ICD-10-CM | POA: Diagnosis not present

## 2024-02-12 DIAGNOSIS — G35 Multiple sclerosis: Secondary | ICD-10-CM | POA: Diagnosis not present

## 2024-02-12 DIAGNOSIS — G8220 Paraplegia, unspecified: Secondary | ICD-10-CM | POA: Diagnosis not present

## 2024-02-12 DIAGNOSIS — G373 Acute transverse myelitis in demyelinating disease of central nervous system: Secondary | ICD-10-CM | POA: Diagnosis not present

## 2024-02-12 DIAGNOSIS — I10 Essential (primary) hypertension: Secondary | ICD-10-CM | POA: Diagnosis not present

## 2024-02-12 DIAGNOSIS — K219 Gastro-esophageal reflux disease without esophagitis: Secondary | ICD-10-CM | POA: Diagnosis not present

## 2024-02-12 DIAGNOSIS — Z87440 Personal history of urinary (tract) infections: Secondary | ICD-10-CM | POA: Diagnosis not present

## 2024-02-12 DIAGNOSIS — E559 Vitamin D deficiency, unspecified: Secondary | ICD-10-CM | POA: Diagnosis not present

## 2024-02-17 DIAGNOSIS — G8220 Paraplegia, unspecified: Secondary | ICD-10-CM | POA: Diagnosis not present

## 2024-02-17 DIAGNOSIS — Z87440 Personal history of urinary (tract) infections: Secondary | ICD-10-CM | POA: Diagnosis not present

## 2024-02-17 DIAGNOSIS — K219 Gastro-esophageal reflux disease without esophagitis: Secondary | ICD-10-CM | POA: Diagnosis not present

## 2024-02-17 DIAGNOSIS — I10 Essential (primary) hypertension: Secondary | ICD-10-CM | POA: Diagnosis not present

## 2024-02-17 DIAGNOSIS — G373 Acute transverse myelitis in demyelinating disease of central nervous system: Secondary | ICD-10-CM | POA: Diagnosis not present

## 2024-02-17 DIAGNOSIS — G35 Multiple sclerosis: Secondary | ICD-10-CM | POA: Diagnosis not present

## 2024-02-17 DIAGNOSIS — D849 Immunodeficiency, unspecified: Secondary | ICD-10-CM | POA: Diagnosis not present

## 2024-02-17 DIAGNOSIS — E559 Vitamin D deficiency, unspecified: Secondary | ICD-10-CM | POA: Diagnosis not present

## 2024-02-19 DIAGNOSIS — G35 Multiple sclerosis: Secondary | ICD-10-CM | POA: Diagnosis not present

## 2024-02-19 DIAGNOSIS — G373 Acute transverse myelitis in demyelinating disease of central nervous system: Secondary | ICD-10-CM | POA: Diagnosis not present

## 2024-02-19 DIAGNOSIS — D849 Immunodeficiency, unspecified: Secondary | ICD-10-CM | POA: Diagnosis not present

## 2024-02-19 DIAGNOSIS — Z87440 Personal history of urinary (tract) infections: Secondary | ICD-10-CM | POA: Diagnosis not present

## 2024-02-19 DIAGNOSIS — G8220 Paraplegia, unspecified: Secondary | ICD-10-CM | POA: Diagnosis not present

## 2024-02-19 DIAGNOSIS — E559 Vitamin D deficiency, unspecified: Secondary | ICD-10-CM | POA: Diagnosis not present

## 2024-02-19 DIAGNOSIS — K219 Gastro-esophageal reflux disease without esophagitis: Secondary | ICD-10-CM | POA: Diagnosis not present

## 2024-02-19 DIAGNOSIS — I10 Essential (primary) hypertension: Secondary | ICD-10-CM | POA: Diagnosis not present

## 2024-02-24 DIAGNOSIS — I10 Essential (primary) hypertension: Secondary | ICD-10-CM | POA: Diagnosis not present

## 2024-02-24 DIAGNOSIS — Z87440 Personal history of urinary (tract) infections: Secondary | ICD-10-CM | POA: Diagnosis not present

## 2024-02-24 DIAGNOSIS — D849 Immunodeficiency, unspecified: Secondary | ICD-10-CM | POA: Diagnosis not present

## 2024-02-24 DIAGNOSIS — G8220 Paraplegia, unspecified: Secondary | ICD-10-CM | POA: Diagnosis not present

## 2024-02-24 DIAGNOSIS — G373 Acute transverse myelitis in demyelinating disease of central nervous system: Secondary | ICD-10-CM | POA: Diagnosis not present

## 2024-02-24 DIAGNOSIS — K219 Gastro-esophageal reflux disease without esophagitis: Secondary | ICD-10-CM | POA: Diagnosis not present

## 2024-02-24 DIAGNOSIS — G35 Multiple sclerosis: Secondary | ICD-10-CM | POA: Diagnosis not present

## 2024-02-24 DIAGNOSIS — E559 Vitamin D deficiency, unspecified: Secondary | ICD-10-CM | POA: Diagnosis not present

## 2024-02-26 DIAGNOSIS — I10 Essential (primary) hypertension: Secondary | ICD-10-CM | POA: Diagnosis not present

## 2024-02-26 DIAGNOSIS — G35 Multiple sclerosis: Secondary | ICD-10-CM | POA: Diagnosis not present

## 2024-02-26 DIAGNOSIS — G373 Acute transverse myelitis in demyelinating disease of central nervous system: Secondary | ICD-10-CM | POA: Diagnosis not present

## 2024-02-26 DIAGNOSIS — Z87440 Personal history of urinary (tract) infections: Secondary | ICD-10-CM | POA: Diagnosis not present

## 2024-02-26 DIAGNOSIS — K219 Gastro-esophageal reflux disease without esophagitis: Secondary | ICD-10-CM | POA: Diagnosis not present

## 2024-02-26 DIAGNOSIS — D849 Immunodeficiency, unspecified: Secondary | ICD-10-CM | POA: Diagnosis not present

## 2024-02-26 DIAGNOSIS — G8220 Paraplegia, unspecified: Secondary | ICD-10-CM | POA: Diagnosis not present

## 2024-02-26 DIAGNOSIS — E559 Vitamin D deficiency, unspecified: Secondary | ICD-10-CM | POA: Diagnosis not present

## 2024-02-29 DIAGNOSIS — I10 Essential (primary) hypertension: Secondary | ICD-10-CM | POA: Diagnosis not present

## 2024-02-29 DIAGNOSIS — G373 Acute transverse myelitis in demyelinating disease of central nervous system: Secondary | ICD-10-CM | POA: Diagnosis not present

## 2024-02-29 DIAGNOSIS — E559 Vitamin D deficiency, unspecified: Secondary | ICD-10-CM | POA: Diagnosis not present

## 2024-02-29 DIAGNOSIS — K219 Gastro-esophageal reflux disease without esophagitis: Secondary | ICD-10-CM | POA: Diagnosis not present

## 2024-02-29 DIAGNOSIS — G35 Multiple sclerosis: Secondary | ICD-10-CM | POA: Diagnosis not present

## 2024-02-29 DIAGNOSIS — D849 Immunodeficiency, unspecified: Secondary | ICD-10-CM | POA: Diagnosis not present

## 2024-02-29 DIAGNOSIS — Z87440 Personal history of urinary (tract) infections: Secondary | ICD-10-CM | POA: Diagnosis not present

## 2024-02-29 DIAGNOSIS — G8220 Paraplegia, unspecified: Secondary | ICD-10-CM | POA: Diagnosis not present

## 2024-03-01 DIAGNOSIS — G8220 Paraplegia, unspecified: Secondary | ICD-10-CM | POA: Diagnosis not present

## 2024-03-01 DIAGNOSIS — I10 Essential (primary) hypertension: Secondary | ICD-10-CM | POA: Diagnosis not present

## 2024-03-01 DIAGNOSIS — D849 Immunodeficiency, unspecified: Secondary | ICD-10-CM | POA: Diagnosis not present

## 2024-03-01 DIAGNOSIS — E559 Vitamin D deficiency, unspecified: Secondary | ICD-10-CM | POA: Diagnosis not present

## 2024-03-01 DIAGNOSIS — Z87440 Personal history of urinary (tract) infections: Secondary | ICD-10-CM | POA: Diagnosis not present

## 2024-03-01 DIAGNOSIS — K219 Gastro-esophageal reflux disease without esophagitis: Secondary | ICD-10-CM | POA: Diagnosis not present

## 2024-03-01 DIAGNOSIS — G373 Acute transverse myelitis in demyelinating disease of central nervous system: Secondary | ICD-10-CM | POA: Diagnosis not present

## 2024-03-01 DIAGNOSIS — G35 Multiple sclerosis: Secondary | ICD-10-CM | POA: Diagnosis not present

## 2024-03-02 DIAGNOSIS — G373 Acute transverse myelitis in demyelinating disease of central nervous system: Secondary | ICD-10-CM | POA: Diagnosis not present

## 2024-03-02 DIAGNOSIS — G8220 Paraplegia, unspecified: Secondary | ICD-10-CM | POA: Diagnosis not present

## 2024-03-02 DIAGNOSIS — K219 Gastro-esophageal reflux disease without esophagitis: Secondary | ICD-10-CM | POA: Diagnosis not present

## 2024-03-02 DIAGNOSIS — D849 Immunodeficiency, unspecified: Secondary | ICD-10-CM | POA: Diagnosis not present

## 2024-03-02 DIAGNOSIS — G35 Multiple sclerosis: Secondary | ICD-10-CM | POA: Diagnosis not present

## 2024-03-02 DIAGNOSIS — E559 Vitamin D deficiency, unspecified: Secondary | ICD-10-CM | POA: Diagnosis not present

## 2024-03-02 DIAGNOSIS — I10 Essential (primary) hypertension: Secondary | ICD-10-CM | POA: Diagnosis not present

## 2024-03-02 DIAGNOSIS — Z87440 Personal history of urinary (tract) infections: Secondary | ICD-10-CM | POA: Diagnosis not present

## 2024-03-09 DIAGNOSIS — G35 Multiple sclerosis: Secondary | ICD-10-CM | POA: Diagnosis not present

## 2024-03-09 DIAGNOSIS — E559 Vitamin D deficiency, unspecified: Secondary | ICD-10-CM | POA: Diagnosis not present

## 2024-03-09 DIAGNOSIS — I10 Essential (primary) hypertension: Secondary | ICD-10-CM | POA: Diagnosis not present

## 2024-03-09 DIAGNOSIS — Z87440 Personal history of urinary (tract) infections: Secondary | ICD-10-CM | POA: Diagnosis not present

## 2024-03-09 DIAGNOSIS — G373 Acute transverse myelitis in demyelinating disease of central nervous system: Secondary | ICD-10-CM | POA: Diagnosis not present

## 2024-03-09 DIAGNOSIS — K219 Gastro-esophageal reflux disease without esophagitis: Secondary | ICD-10-CM | POA: Diagnosis not present

## 2024-03-09 DIAGNOSIS — D849 Immunodeficiency, unspecified: Secondary | ICD-10-CM | POA: Diagnosis not present

## 2024-03-09 DIAGNOSIS — G8220 Paraplegia, unspecified: Secondary | ICD-10-CM | POA: Diagnosis not present

## 2024-03-11 DIAGNOSIS — G35 Multiple sclerosis: Secondary | ICD-10-CM | POA: Diagnosis not present

## 2024-03-11 DIAGNOSIS — I10 Essential (primary) hypertension: Secondary | ICD-10-CM | POA: Diagnosis not present

## 2024-03-11 DIAGNOSIS — G8220 Paraplegia, unspecified: Secondary | ICD-10-CM | POA: Diagnosis not present

## 2024-03-11 DIAGNOSIS — K219 Gastro-esophageal reflux disease without esophagitis: Secondary | ICD-10-CM | POA: Diagnosis not present

## 2024-03-11 DIAGNOSIS — D849 Immunodeficiency, unspecified: Secondary | ICD-10-CM | POA: Diagnosis not present

## 2024-03-11 DIAGNOSIS — E559 Vitamin D deficiency, unspecified: Secondary | ICD-10-CM | POA: Diagnosis not present

## 2024-03-11 DIAGNOSIS — G373 Acute transverse myelitis in demyelinating disease of central nervous system: Secondary | ICD-10-CM | POA: Diagnosis not present

## 2024-03-11 DIAGNOSIS — Z87440 Personal history of urinary (tract) infections: Secondary | ICD-10-CM | POA: Diagnosis not present

## 2024-03-16 DIAGNOSIS — K219 Gastro-esophageal reflux disease without esophagitis: Secondary | ICD-10-CM | POA: Diagnosis not present

## 2024-03-16 DIAGNOSIS — E559 Vitamin D deficiency, unspecified: Secondary | ICD-10-CM | POA: Diagnosis not present

## 2024-03-16 DIAGNOSIS — D849 Immunodeficiency, unspecified: Secondary | ICD-10-CM | POA: Diagnosis not present

## 2024-03-16 DIAGNOSIS — I10 Essential (primary) hypertension: Secondary | ICD-10-CM | POA: Diagnosis not present

## 2024-03-16 DIAGNOSIS — G8220 Paraplegia, unspecified: Secondary | ICD-10-CM | POA: Diagnosis not present

## 2024-03-16 DIAGNOSIS — Z87440 Personal history of urinary (tract) infections: Secondary | ICD-10-CM | POA: Diagnosis not present

## 2024-03-16 DIAGNOSIS — G373 Acute transverse myelitis in demyelinating disease of central nervous system: Secondary | ICD-10-CM | POA: Diagnosis not present

## 2024-03-16 DIAGNOSIS — G35 Multiple sclerosis: Secondary | ICD-10-CM | POA: Diagnosis not present

## 2024-03-17 DIAGNOSIS — G373 Acute transverse myelitis in demyelinating disease of central nervous system: Secondary | ICD-10-CM | POA: Diagnosis not present

## 2024-03-17 DIAGNOSIS — D849 Immunodeficiency, unspecified: Secondary | ICD-10-CM | POA: Diagnosis not present

## 2024-03-17 DIAGNOSIS — K219 Gastro-esophageal reflux disease without esophagitis: Secondary | ICD-10-CM | POA: Diagnosis not present

## 2024-03-17 DIAGNOSIS — Z87440 Personal history of urinary (tract) infections: Secondary | ICD-10-CM | POA: Diagnosis not present

## 2024-03-17 DIAGNOSIS — I10 Essential (primary) hypertension: Secondary | ICD-10-CM | POA: Diagnosis not present

## 2024-03-17 DIAGNOSIS — G8220 Paraplegia, unspecified: Secondary | ICD-10-CM | POA: Diagnosis not present

## 2024-03-17 DIAGNOSIS — E559 Vitamin D deficiency, unspecified: Secondary | ICD-10-CM | POA: Diagnosis not present

## 2024-03-17 DIAGNOSIS — G35 Multiple sclerosis: Secondary | ICD-10-CM | POA: Diagnosis not present

## 2024-03-18 DIAGNOSIS — G35 Multiple sclerosis: Secondary | ICD-10-CM | POA: Diagnosis not present

## 2024-03-18 DIAGNOSIS — G8220 Paraplegia, unspecified: Secondary | ICD-10-CM | POA: Diagnosis not present

## 2024-03-18 DIAGNOSIS — G373 Acute transverse myelitis in demyelinating disease of central nervous system: Secondary | ICD-10-CM | POA: Diagnosis not present

## 2024-03-18 DIAGNOSIS — D849 Immunodeficiency, unspecified: Secondary | ICD-10-CM | POA: Diagnosis not present

## 2024-03-18 DIAGNOSIS — Z87440 Personal history of urinary (tract) infections: Secondary | ICD-10-CM | POA: Diagnosis not present

## 2024-03-18 DIAGNOSIS — E559 Vitamin D deficiency, unspecified: Secondary | ICD-10-CM | POA: Diagnosis not present

## 2024-03-18 DIAGNOSIS — K219 Gastro-esophageal reflux disease without esophagitis: Secondary | ICD-10-CM | POA: Diagnosis not present

## 2024-03-18 DIAGNOSIS — I10 Essential (primary) hypertension: Secondary | ICD-10-CM | POA: Diagnosis not present

## 2024-03-21 DIAGNOSIS — G8220 Paraplegia, unspecified: Secondary | ICD-10-CM | POA: Diagnosis not present

## 2024-03-21 DIAGNOSIS — G373 Acute transverse myelitis in demyelinating disease of central nervous system: Secondary | ICD-10-CM | POA: Diagnosis not present

## 2024-03-21 DIAGNOSIS — E559 Vitamin D deficiency, unspecified: Secondary | ICD-10-CM | POA: Diagnosis not present

## 2024-03-21 DIAGNOSIS — I10 Essential (primary) hypertension: Secondary | ICD-10-CM | POA: Diagnosis not present

## 2024-03-21 DIAGNOSIS — D849 Immunodeficiency, unspecified: Secondary | ICD-10-CM | POA: Diagnosis not present

## 2024-03-21 DIAGNOSIS — K219 Gastro-esophageal reflux disease without esophagitis: Secondary | ICD-10-CM | POA: Diagnosis not present

## 2024-03-21 DIAGNOSIS — Z87440 Personal history of urinary (tract) infections: Secondary | ICD-10-CM | POA: Diagnosis not present

## 2024-03-21 DIAGNOSIS — G35 Multiple sclerosis: Secondary | ICD-10-CM | POA: Diagnosis not present

## 2024-03-22 DIAGNOSIS — G373 Acute transverse myelitis in demyelinating disease of central nervous system: Secondary | ICD-10-CM | POA: Diagnosis not present

## 2024-03-22 DIAGNOSIS — Z87440 Personal history of urinary (tract) infections: Secondary | ICD-10-CM | POA: Diagnosis not present

## 2024-03-22 DIAGNOSIS — G35 Multiple sclerosis: Secondary | ICD-10-CM | POA: Diagnosis not present

## 2024-03-22 DIAGNOSIS — K219 Gastro-esophageal reflux disease without esophagitis: Secondary | ICD-10-CM | POA: Diagnosis not present

## 2024-03-22 DIAGNOSIS — D849 Immunodeficiency, unspecified: Secondary | ICD-10-CM | POA: Diagnosis not present

## 2024-03-22 DIAGNOSIS — I10 Essential (primary) hypertension: Secondary | ICD-10-CM | POA: Diagnosis not present

## 2024-03-22 DIAGNOSIS — G8220 Paraplegia, unspecified: Secondary | ICD-10-CM | POA: Diagnosis not present

## 2024-03-22 DIAGNOSIS — E559 Vitamin D deficiency, unspecified: Secondary | ICD-10-CM | POA: Diagnosis not present

## 2024-03-31 DIAGNOSIS — G373 Acute transverse myelitis in demyelinating disease of central nervous system: Secondary | ICD-10-CM | POA: Diagnosis not present

## 2024-03-31 DIAGNOSIS — K219 Gastro-esophageal reflux disease without esophagitis: Secondary | ICD-10-CM | POA: Diagnosis not present

## 2024-03-31 DIAGNOSIS — Z87440 Personal history of urinary (tract) infections: Secondary | ICD-10-CM | POA: Diagnosis not present

## 2024-03-31 DIAGNOSIS — E559 Vitamin D deficiency, unspecified: Secondary | ICD-10-CM | POA: Diagnosis not present

## 2024-03-31 DIAGNOSIS — G35 Multiple sclerosis: Secondary | ICD-10-CM | POA: Diagnosis not present

## 2024-03-31 DIAGNOSIS — D849 Immunodeficiency, unspecified: Secondary | ICD-10-CM | POA: Diagnosis not present

## 2024-03-31 DIAGNOSIS — G8220 Paraplegia, unspecified: Secondary | ICD-10-CM | POA: Diagnosis not present

## 2024-03-31 DIAGNOSIS — I10 Essential (primary) hypertension: Secondary | ICD-10-CM | POA: Diagnosis not present

## 2024-04-02 DIAGNOSIS — I10 Essential (primary) hypertension: Secondary | ICD-10-CM | POA: Diagnosis not present

## 2024-04-02 DIAGNOSIS — K219 Gastro-esophageal reflux disease without esophagitis: Secondary | ICD-10-CM | POA: Diagnosis not present

## 2024-04-02 DIAGNOSIS — E559 Vitamin D deficiency, unspecified: Secondary | ICD-10-CM | POA: Diagnosis not present

## 2024-04-02 DIAGNOSIS — D849 Immunodeficiency, unspecified: Secondary | ICD-10-CM | POA: Diagnosis not present

## 2024-04-02 DIAGNOSIS — G373 Acute transverse myelitis in demyelinating disease of central nervous system: Secondary | ICD-10-CM | POA: Diagnosis not present

## 2024-04-02 DIAGNOSIS — G8220 Paraplegia, unspecified: Secondary | ICD-10-CM | POA: Diagnosis not present

## 2024-04-02 DIAGNOSIS — G35 Multiple sclerosis: Secondary | ICD-10-CM | POA: Diagnosis not present

## 2024-04-02 DIAGNOSIS — Z87440 Personal history of urinary (tract) infections: Secondary | ICD-10-CM | POA: Diagnosis not present

## 2024-04-07 DIAGNOSIS — G373 Acute transverse myelitis in demyelinating disease of central nervous system: Secondary | ICD-10-CM | POA: Diagnosis not present

## 2024-04-07 DIAGNOSIS — Z87440 Personal history of urinary (tract) infections: Secondary | ICD-10-CM | POA: Diagnosis not present

## 2024-04-07 DIAGNOSIS — D849 Immunodeficiency, unspecified: Secondary | ICD-10-CM | POA: Diagnosis not present

## 2024-04-07 DIAGNOSIS — I10 Essential (primary) hypertension: Secondary | ICD-10-CM | POA: Diagnosis not present

## 2024-04-07 DIAGNOSIS — E559 Vitamin D deficiency, unspecified: Secondary | ICD-10-CM | POA: Diagnosis not present

## 2024-04-07 DIAGNOSIS — K219 Gastro-esophageal reflux disease without esophagitis: Secondary | ICD-10-CM | POA: Diagnosis not present

## 2024-04-07 DIAGNOSIS — G8220 Paraplegia, unspecified: Secondary | ICD-10-CM | POA: Diagnosis not present

## 2024-04-07 DIAGNOSIS — G35 Multiple sclerosis: Secondary | ICD-10-CM | POA: Diagnosis not present

## 2024-04-08 DIAGNOSIS — G35 Multiple sclerosis: Secondary | ICD-10-CM | POA: Diagnosis not present

## 2024-04-08 DIAGNOSIS — I10 Essential (primary) hypertension: Secondary | ICD-10-CM | POA: Diagnosis not present

## 2024-04-08 DIAGNOSIS — G8220 Paraplegia, unspecified: Secondary | ICD-10-CM | POA: Diagnosis not present

## 2024-04-08 DIAGNOSIS — Z87440 Personal history of urinary (tract) infections: Secondary | ICD-10-CM | POA: Diagnosis not present

## 2024-04-08 DIAGNOSIS — G373 Acute transverse myelitis in demyelinating disease of central nervous system: Secondary | ICD-10-CM | POA: Diagnosis not present

## 2024-04-08 DIAGNOSIS — D849 Immunodeficiency, unspecified: Secondary | ICD-10-CM | POA: Diagnosis not present

## 2024-04-08 DIAGNOSIS — E559 Vitamin D deficiency, unspecified: Secondary | ICD-10-CM | POA: Diagnosis not present

## 2024-04-08 DIAGNOSIS — K219 Gastro-esophageal reflux disease without esophagitis: Secondary | ICD-10-CM | POA: Diagnosis not present

## 2024-04-14 DIAGNOSIS — E559 Vitamin D deficiency, unspecified: Secondary | ICD-10-CM | POA: Diagnosis not present

## 2024-04-14 DIAGNOSIS — I10 Essential (primary) hypertension: Secondary | ICD-10-CM | POA: Diagnosis not present

## 2024-04-14 DIAGNOSIS — K219 Gastro-esophageal reflux disease without esophagitis: Secondary | ICD-10-CM | POA: Diagnosis not present

## 2024-04-14 DIAGNOSIS — G373 Acute transverse myelitis in demyelinating disease of central nervous system: Secondary | ICD-10-CM | POA: Diagnosis not present

## 2024-04-14 DIAGNOSIS — G8220 Paraplegia, unspecified: Secondary | ICD-10-CM | POA: Diagnosis not present

## 2024-04-14 DIAGNOSIS — Z87440 Personal history of urinary (tract) infections: Secondary | ICD-10-CM | POA: Diagnosis not present

## 2024-04-14 DIAGNOSIS — G35 Multiple sclerosis: Secondary | ICD-10-CM | POA: Diagnosis not present

## 2024-04-14 DIAGNOSIS — D849 Immunodeficiency, unspecified: Secondary | ICD-10-CM | POA: Diagnosis not present

## 2024-04-15 DIAGNOSIS — I10 Essential (primary) hypertension: Secondary | ICD-10-CM | POA: Diagnosis not present

## 2024-04-15 DIAGNOSIS — G373 Acute transverse myelitis in demyelinating disease of central nervous system: Secondary | ICD-10-CM | POA: Diagnosis not present

## 2024-04-15 DIAGNOSIS — G35 Multiple sclerosis: Secondary | ICD-10-CM | POA: Diagnosis not present

## 2024-04-15 DIAGNOSIS — K219 Gastro-esophageal reflux disease without esophagitis: Secondary | ICD-10-CM | POA: Diagnosis not present

## 2024-04-15 DIAGNOSIS — G8220 Paraplegia, unspecified: Secondary | ICD-10-CM | POA: Diagnosis not present

## 2024-04-15 DIAGNOSIS — E559 Vitamin D deficiency, unspecified: Secondary | ICD-10-CM | POA: Diagnosis not present

## 2024-04-15 DIAGNOSIS — Z87440 Personal history of urinary (tract) infections: Secondary | ICD-10-CM | POA: Diagnosis not present

## 2024-04-15 DIAGNOSIS — D849 Immunodeficiency, unspecified: Secondary | ICD-10-CM | POA: Diagnosis not present

## 2024-04-19 ENCOUNTER — Other Ambulatory Visit (HOSPITAL_COMMUNITY): Payer: Self-pay | Admitting: Neurology

## 2024-04-19 DIAGNOSIS — G35D Multiple sclerosis, unspecified: Secondary | ICD-10-CM

## 2024-04-19 DIAGNOSIS — G35 Multiple sclerosis: Secondary | ICD-10-CM

## 2024-04-22 DIAGNOSIS — E559 Vitamin D deficiency, unspecified: Secondary | ICD-10-CM | POA: Diagnosis not present

## 2024-04-22 DIAGNOSIS — K219 Gastro-esophageal reflux disease without esophagitis: Secondary | ICD-10-CM | POA: Diagnosis not present

## 2024-04-22 DIAGNOSIS — I10 Essential (primary) hypertension: Secondary | ICD-10-CM | POA: Diagnosis not present

## 2024-04-22 DIAGNOSIS — D849 Immunodeficiency, unspecified: Secondary | ICD-10-CM | POA: Diagnosis not present

## 2024-04-22 DIAGNOSIS — G8220 Paraplegia, unspecified: Secondary | ICD-10-CM | POA: Diagnosis not present

## 2024-04-22 DIAGNOSIS — G373 Acute transverse myelitis in demyelinating disease of central nervous system: Secondary | ICD-10-CM | POA: Diagnosis not present

## 2024-04-22 DIAGNOSIS — Z87440 Personal history of urinary (tract) infections: Secondary | ICD-10-CM | POA: Diagnosis not present

## 2024-04-22 DIAGNOSIS — G35 Multiple sclerosis: Secondary | ICD-10-CM | POA: Diagnosis not present

## 2024-04-27 DIAGNOSIS — G8220 Paraplegia, unspecified: Secondary | ICD-10-CM | POA: Diagnosis not present

## 2024-04-27 DIAGNOSIS — I10 Essential (primary) hypertension: Secondary | ICD-10-CM | POA: Diagnosis not present

## 2024-04-27 DIAGNOSIS — G35 Multiple sclerosis: Secondary | ICD-10-CM | POA: Diagnosis not present

## 2024-04-27 DIAGNOSIS — D849 Immunodeficiency, unspecified: Secondary | ICD-10-CM | POA: Diagnosis not present

## 2024-04-27 DIAGNOSIS — K219 Gastro-esophageal reflux disease without esophagitis: Secondary | ICD-10-CM | POA: Diagnosis not present

## 2024-04-27 DIAGNOSIS — E559 Vitamin D deficiency, unspecified: Secondary | ICD-10-CM | POA: Diagnosis not present

## 2024-04-27 DIAGNOSIS — G373 Acute transverse myelitis in demyelinating disease of central nervous system: Secondary | ICD-10-CM | POA: Diagnosis not present

## 2024-04-27 DIAGNOSIS — Z87440 Personal history of urinary (tract) infections: Secondary | ICD-10-CM | POA: Diagnosis not present

## 2024-05-04 DIAGNOSIS — G373 Acute transverse myelitis in demyelinating disease of central nervous system: Secondary | ICD-10-CM | POA: Diagnosis not present

## 2024-05-04 DIAGNOSIS — I10 Essential (primary) hypertension: Secondary | ICD-10-CM | POA: Diagnosis not present

## 2024-05-04 DIAGNOSIS — G35 Multiple sclerosis: Secondary | ICD-10-CM | POA: Diagnosis not present

## 2024-05-04 DIAGNOSIS — G8220 Paraplegia, unspecified: Secondary | ICD-10-CM | POA: Diagnosis not present

## 2024-05-04 DIAGNOSIS — E559 Vitamin D deficiency, unspecified: Secondary | ICD-10-CM | POA: Diagnosis not present

## 2024-05-04 DIAGNOSIS — Z87440 Personal history of urinary (tract) infections: Secondary | ICD-10-CM | POA: Diagnosis not present

## 2024-05-04 DIAGNOSIS — D849 Immunodeficiency, unspecified: Secondary | ICD-10-CM | POA: Diagnosis not present

## 2024-05-04 DIAGNOSIS — K219 Gastro-esophageal reflux disease without esophagitis: Secondary | ICD-10-CM | POA: Diagnosis not present

## 2024-05-09 DIAGNOSIS — G35 Multiple sclerosis: Secondary | ICD-10-CM | POA: Diagnosis not present

## 2024-05-09 DIAGNOSIS — D849 Immunodeficiency, unspecified: Secondary | ICD-10-CM | POA: Diagnosis not present

## 2024-05-09 DIAGNOSIS — K219 Gastro-esophageal reflux disease without esophagitis: Secondary | ICD-10-CM | POA: Diagnosis not present

## 2024-05-09 DIAGNOSIS — Z87440 Personal history of urinary (tract) infections: Secondary | ICD-10-CM | POA: Diagnosis not present

## 2024-05-09 DIAGNOSIS — E559 Vitamin D deficiency, unspecified: Secondary | ICD-10-CM | POA: Diagnosis not present

## 2024-05-09 DIAGNOSIS — G8220 Paraplegia, unspecified: Secondary | ICD-10-CM | POA: Diagnosis not present

## 2024-05-09 DIAGNOSIS — G373 Acute transverse myelitis in demyelinating disease of central nervous system: Secondary | ICD-10-CM | POA: Diagnosis not present

## 2024-05-09 DIAGNOSIS — I10 Essential (primary) hypertension: Secondary | ICD-10-CM | POA: Diagnosis not present

## 2024-05-10 DIAGNOSIS — D849 Immunodeficiency, unspecified: Secondary | ICD-10-CM | POA: Diagnosis not present

## 2024-05-10 DIAGNOSIS — I10 Essential (primary) hypertension: Secondary | ICD-10-CM | POA: Diagnosis not present

## 2024-05-10 DIAGNOSIS — G373 Acute transverse myelitis in demyelinating disease of central nervous system: Secondary | ICD-10-CM | POA: Diagnosis not present

## 2024-05-10 DIAGNOSIS — Z87440 Personal history of urinary (tract) infections: Secondary | ICD-10-CM | POA: Diagnosis not present

## 2024-05-10 DIAGNOSIS — G35 Multiple sclerosis: Secondary | ICD-10-CM | POA: Diagnosis not present

## 2024-05-10 DIAGNOSIS — E559 Vitamin D deficiency, unspecified: Secondary | ICD-10-CM | POA: Diagnosis not present

## 2024-05-10 DIAGNOSIS — K219 Gastro-esophageal reflux disease without esophagitis: Secondary | ICD-10-CM | POA: Diagnosis not present

## 2024-05-10 DIAGNOSIS — G8220 Paraplegia, unspecified: Secondary | ICD-10-CM | POA: Diagnosis not present

## 2024-05-13 DIAGNOSIS — K219 Gastro-esophageal reflux disease without esophagitis: Secondary | ICD-10-CM | POA: Diagnosis not present

## 2024-05-13 DIAGNOSIS — G35 Multiple sclerosis: Secondary | ICD-10-CM | POA: Diagnosis not present

## 2024-05-13 DIAGNOSIS — E559 Vitamin D deficiency, unspecified: Secondary | ICD-10-CM | POA: Diagnosis not present

## 2024-05-13 DIAGNOSIS — Z87440 Personal history of urinary (tract) infections: Secondary | ICD-10-CM | POA: Diagnosis not present

## 2024-05-13 DIAGNOSIS — D849 Immunodeficiency, unspecified: Secondary | ICD-10-CM | POA: Diagnosis not present

## 2024-05-13 DIAGNOSIS — G373 Acute transverse myelitis in demyelinating disease of central nervous system: Secondary | ICD-10-CM | POA: Diagnosis not present

## 2024-05-13 DIAGNOSIS — I10 Essential (primary) hypertension: Secondary | ICD-10-CM | POA: Diagnosis not present

## 2024-05-13 DIAGNOSIS — G8220 Paraplegia, unspecified: Secondary | ICD-10-CM | POA: Diagnosis not present

## 2024-05-18 DIAGNOSIS — I10 Essential (primary) hypertension: Secondary | ICD-10-CM | POA: Diagnosis not present

## 2024-05-18 DIAGNOSIS — G35 Multiple sclerosis: Secondary | ICD-10-CM | POA: Diagnosis not present

## 2024-05-18 DIAGNOSIS — E559 Vitamin D deficiency, unspecified: Secondary | ICD-10-CM | POA: Diagnosis not present

## 2024-05-18 DIAGNOSIS — Z87440 Personal history of urinary (tract) infections: Secondary | ICD-10-CM | POA: Diagnosis not present

## 2024-05-18 DIAGNOSIS — K219 Gastro-esophageal reflux disease without esophagitis: Secondary | ICD-10-CM | POA: Diagnosis not present

## 2024-05-18 DIAGNOSIS — D849 Immunodeficiency, unspecified: Secondary | ICD-10-CM | POA: Diagnosis not present

## 2024-05-18 DIAGNOSIS — G373 Acute transverse myelitis in demyelinating disease of central nervous system: Secondary | ICD-10-CM | POA: Diagnosis not present

## 2024-05-18 DIAGNOSIS — G8220 Paraplegia, unspecified: Secondary | ICD-10-CM | POA: Diagnosis not present

## 2024-05-27 DIAGNOSIS — G35 Multiple sclerosis: Secondary | ICD-10-CM | POA: Diagnosis not present

## 2024-05-27 DIAGNOSIS — G373 Acute transverse myelitis in demyelinating disease of central nervous system: Secondary | ICD-10-CM | POA: Diagnosis not present

## 2024-05-27 DIAGNOSIS — Z87440 Personal history of urinary (tract) infections: Secondary | ICD-10-CM | POA: Diagnosis not present

## 2024-05-27 DIAGNOSIS — K219 Gastro-esophageal reflux disease without esophagitis: Secondary | ICD-10-CM | POA: Diagnosis not present

## 2024-05-27 DIAGNOSIS — D849 Immunodeficiency, unspecified: Secondary | ICD-10-CM | POA: Diagnosis not present

## 2024-05-27 DIAGNOSIS — G8220 Paraplegia, unspecified: Secondary | ICD-10-CM | POA: Diagnosis not present

## 2024-05-27 DIAGNOSIS — I10 Essential (primary) hypertension: Secondary | ICD-10-CM | POA: Diagnosis not present

## 2024-05-27 DIAGNOSIS — E559 Vitamin D deficiency, unspecified: Secondary | ICD-10-CM | POA: Diagnosis not present

## 2024-06-01 DIAGNOSIS — E559 Vitamin D deficiency, unspecified: Secondary | ICD-10-CM | POA: Diagnosis not present

## 2024-06-01 DIAGNOSIS — D849 Immunodeficiency, unspecified: Secondary | ICD-10-CM | POA: Diagnosis not present

## 2024-06-01 DIAGNOSIS — G8220 Paraplegia, unspecified: Secondary | ICD-10-CM | POA: Diagnosis not present

## 2024-06-01 DIAGNOSIS — G373 Acute transverse myelitis in demyelinating disease of central nervous system: Secondary | ICD-10-CM | POA: Diagnosis not present

## 2024-06-01 DIAGNOSIS — Z87440 Personal history of urinary (tract) infections: Secondary | ICD-10-CM | POA: Diagnosis not present

## 2024-06-01 DIAGNOSIS — I10 Essential (primary) hypertension: Secondary | ICD-10-CM | POA: Diagnosis not present

## 2024-06-01 DIAGNOSIS — K219 Gastro-esophageal reflux disease without esophagitis: Secondary | ICD-10-CM | POA: Diagnosis not present

## 2024-06-01 DIAGNOSIS — G35 Multiple sclerosis: Secondary | ICD-10-CM | POA: Diagnosis not present

## 2024-06-08 DIAGNOSIS — G35 Multiple sclerosis: Secondary | ICD-10-CM | POA: Diagnosis not present

## 2024-06-08 DIAGNOSIS — I10 Essential (primary) hypertension: Secondary | ICD-10-CM | POA: Diagnosis not present

## 2024-06-08 DIAGNOSIS — Z87440 Personal history of urinary (tract) infections: Secondary | ICD-10-CM | POA: Diagnosis not present

## 2024-06-08 DIAGNOSIS — K219 Gastro-esophageal reflux disease without esophagitis: Secondary | ICD-10-CM | POA: Diagnosis not present

## 2024-06-08 DIAGNOSIS — G373 Acute transverse myelitis in demyelinating disease of central nervous system: Secondary | ICD-10-CM | POA: Diagnosis not present

## 2024-06-08 DIAGNOSIS — G8220 Paraplegia, unspecified: Secondary | ICD-10-CM | POA: Diagnosis not present

## 2024-06-08 DIAGNOSIS — D849 Immunodeficiency, unspecified: Secondary | ICD-10-CM | POA: Diagnosis not present

## 2024-06-08 DIAGNOSIS — E559 Vitamin D deficiency, unspecified: Secondary | ICD-10-CM | POA: Diagnosis not present

## 2024-06-11 ENCOUNTER — Telehealth: Admitting: Family Medicine

## 2024-06-11 DIAGNOSIS — N3000 Acute cystitis without hematuria: Secondary | ICD-10-CM

## 2024-06-11 MED ORDER — ACETAMINOPHEN ER 650 MG PO TBCR: 650.0000 mg | EXTENDED_RELEASE_TABLET | Freq: Three times a day (TID) | ORAL | 0 refills | Status: AC | PRN

## 2024-06-11 MED ORDER — NITROFURANTOIN MONOHYD MACRO 100 MG PO CAPS: 100.0000 mg | ORAL_CAPSULE | Freq: Two times a day (BID) | ORAL | 0 refills | Status: AC

## 2024-06-11 NOTE — Progress Notes (Signed)
 Virtual Visit Consent   Leontina Skidmore, you are scheduled for a virtual visit with a [facility] provider today. Just as with appointments in the office, your consent must be obtained to participate. Your consent will be active for this visit and any virtual visit you may have with one of our providers in the next 365 days. If you have a MyChart account, a copy of this consent can be sent to you electronically.  As this is a virtual visit, video technology does not allow for your provider to perform a traditional examination. This may limit your provider's ability to fully assess your condition. If your provider identifies any concerns that need to be evaluated in person or the need to arrange testing (such as labs, EKG, etc.), we will make arrangements to do so. Although advances in technology are sophisticated, we cannot ensure that it will always work on either your end or our end. If the connection with a video visit is poor, the visit may have to be switched to a telephone visit. With either a video or telephone visit, we are not always able to ensure that we have a secure connection.  By engaging in this virtual visit, you consent to the provision of healthcare and authorize for your insurance to be billed (if applicable) for the services provided during this visit. Depending on your insurance coverage, you may receive a charge related to this service.  I need to obtain your verbal consent now. Are you willing to proceed with your visit today? Yoshiko Keleher has provided verbal consent on 06/11/2024 for a virtual visit (video or telephone). Loa Lamp, FNP  Date: 06/11/2024 11:03 AM   Virtual Visit via Video Note   I, Loa Lamp, connected with  Jennice Renegar  ([id number], 1961/07/11) on 06/11/24 at 11:00 AM EDT by a video-enabled telemedicine application and verified that I am speaking with the correct person using two identifiers.  Location: Patient: Virtual Visit Location Patient:  Home Provider: Virtual Visit Location Provider: Home Office   I discussed the limitations of evaluation and management by telemedicine and the availability of in person appointments. The patient expressed understanding and agreed to proceed.    History of Present Illness: Joniya Boberg is a 63 y.o. who identifies as a female who was assigned female at birth, and is being seen today for burning and pressure with urination. No fever or severe abd pain. She also has MS. SABRA  HPI: HPI  Problems: There are no active problems to display for this patient.   Allergies: No Known Allergies Medications:  Current Outpatient Medications:    acetaminophen (TYLENOL 8 HOUR) 650 MG CR tablet, Take 1 tablet (650 mg total) by mouth every 8 (eight) hours as needed for pain., Disp: 90 tablet, Rfl: 0   nitrofurantoin, macrocrystal-monohydrate, (MACROBID) 100 MG capsule, Take 1 capsule (100 mg total) by mouth 2 (two) times daily for 7 days., Disp: 14 capsule, Rfl: 0   aspirin 81 MG EC tablet, Take 1 tablet by mouth daily., Disp: , Rfl:    benzonatate  (TESSALON ) 100 MG capsule, Take 1 capsule (100 mg total) by mouth 3 (three) times daily as needed., Disp: 30 capsule, Rfl: 0   KESIMPTA 20 MG/0.4ML SOAJ, Inject into the skin every 30 (thirty) days., Disp: , Rfl:    triamterene-hydrochlorothiazide (DYAZIDE) 37.5-25 MG capsule, Take 1 capsule by mouth daily., Disp: , Rfl:   Observations/Objective: Patient is well-developed, well-nourished in no acute distress.  Resting comfortably  at home.  Head  is normocephalic, atraumatic.  No labored breathing.  Speech is clear and coherent with logical content.  Patient is alert and oriented at baseline.    Assessment and Plan: There are no diagnoses linked to this encounter. Increase fluids, preventative measures discussed, UC as needed.   Follow Up Instructions: I discussed the assessment and treatment plan with the patient. The patient was provided an opportunity to  ask questions and all were answered. The patient agreed with the plan and demonstrated an understanding of the instructions.  A copy of instructions were sent to the patient via MyChart unless otherwise noted below.     The patient was advised to call back or seek an in-person evaluation if the symptoms worsen or if the condition fails to improve as anticipated.    Johnchristopher Sarvis, FNP

## 2024-06-11 NOTE — Patient Instructions (Signed)

## 2024-06-17 ENCOUNTER — Telehealth: Admitting: Physician Assistant

## 2024-06-17 DIAGNOSIS — R3989 Other symptoms and signs involving the genitourinary system: Secondary | ICD-10-CM | POA: Diagnosis not present

## 2024-06-17 DIAGNOSIS — D849 Immunodeficiency, unspecified: Secondary | ICD-10-CM | POA: Diagnosis not present

## 2024-06-17 DIAGNOSIS — K219 Gastro-esophageal reflux disease without esophagitis: Secondary | ICD-10-CM | POA: Diagnosis not present

## 2024-06-17 DIAGNOSIS — G8220 Paraplegia, unspecified: Secondary | ICD-10-CM | POA: Diagnosis not present

## 2024-06-17 DIAGNOSIS — E559 Vitamin D deficiency, unspecified: Secondary | ICD-10-CM | POA: Diagnosis not present

## 2024-06-17 DIAGNOSIS — G35 Multiple sclerosis: Secondary | ICD-10-CM | POA: Diagnosis not present

## 2024-06-17 DIAGNOSIS — G373 Acute transverse myelitis in demyelinating disease of central nervous system: Secondary | ICD-10-CM | POA: Diagnosis not present

## 2024-06-17 DIAGNOSIS — I10 Essential (primary) hypertension: Secondary | ICD-10-CM | POA: Diagnosis not present

## 2024-06-17 DIAGNOSIS — Z87440 Personal history of urinary (tract) infections: Secondary | ICD-10-CM | POA: Diagnosis not present

## 2024-06-17 MED ORDER — SULFAMETHOXAZOLE-TRIMETHOPRIM 800-160 MG PO TABS: 1.0000 | ORAL_TABLET | Freq: Two times a day (BID) | ORAL | 0 refills | Status: DC

## 2024-06-17 NOTE — Patient Instructions (Signed)
 Lauren Curry, thank you for joining Lauren CHRISTELLA Dickinson, PA-C for today's virtual visit.  While this provider is not your primary care provider (PCP), if your PCP is located in our provider database this encounter information will be shared with them immediately following your visit.   A [facility] MyChart account gives you access to today's visit and all your visits, tests, and labs performed at [facility]  click here if you don't have a [facility] MyChart account or go to [email or web address]  Consent: (Patient) Lauren Curry provided verbal consent for this virtual visit at the beginning of the encounter.  Current Medications:  Current Outpatient Medications:    acetaminophen  (TYLENOL  8 HOUR) 650 MG CR tablet, Take 1 tablet (650 mg total) by mouth every 8 (eight) hours as needed for pain., Disp: 90 tablet, Rfl: 0   aspirin 81 MG EC tablet, Take 1 tablet by mouth daily., Disp: , Rfl:    benzonatate  (TESSALON ) 100 MG capsule, Take 1 capsule (100 mg total) by mouth 3 (three) times daily as needed., Disp: 30 capsule, Rfl: 0   KESIMPTA 20 MG/0.4ML SOAJ, Inject into the skin every 30 (thirty) days., Disp: , Rfl:    nitrofurantoin , macrocrystal-monohydrate, (MACROBID ) 100 MG capsule, Take 1 capsule (100 mg total) by mouth 2 (two) times daily for 7 days., Disp: 14 capsule, Rfl: 0   sulfamethoxazole-trimethoprim (BACTRIM DS) 800-160 MG tablet, Take 1 tablet by mouth 2 (two) times daily., Disp: 10 tablet, Rfl: 0   triamterene-hydrochlorothiazide (DYAZIDE) 37.5-25 MG capsule, Take 1 capsule by mouth daily., Disp: , Rfl:    Medications ordered in this encounter:  Meds ordered this encounter  Medications   sulfamethoxazole-trimethoprim (BACTRIM DS) 800-160 MG tablet    Sig: Take 1 tablet by mouth 2 (two) times daily.    Dispense:  10 tablet    Refill:  0    Supervising Provider:   BLAISE ALEENE KIDD [[id number]]     *If you need refills on other medications prior to your  next appointment, please contact your pharmacy*  Follow-Up: Call back or seek an in-person evaluation if the symptoms worsen or if the condition fails to improve as anticipated.   Virtual Care [phone]  Other Instructions Urinary Tract Infection, Female A urinary tract infection (UTI) is an infection in your urinary tract. The urinary tract is made up of organs that make, store, and get rid of pee (urine) in your body. These organs include: The kidneys. The ureters. The bladder. The urethra. What are the causes? Most UTIs are caused by germs called bacteria. They may be in or near your genitals. These germs grow and cause swelling in your urinary tract. What increases the risk? You're more likely to get a UTI if: You're a female. The urethra is shorter in females than in males. You have a soft tube called a catheter that drains your pee. You can't control when you pee or poop. You have trouble peeing because of: A kidney stone. A urinary blockage. A nerve condition that affects your bladder. Not getting enough to drink. You're sexually active. You use a birth control inside your vagina, like spermicide. You're pregnant. You have low levels of the hormone estrogen in your body. You're an older adult. You're also more likely to get a UTI if you have other health problems. These may include: Diabetes. A weak immune system. Your immune system is your body's defense system. Sickle cell disease. Injury of the spine. What are the signs or  symptoms? Symptoms may include: Needing to pee right away. Peeing small amounts often. Pain or burning when you pee. Blood in your pee. Pee that smells bad or odd. Pain in your belly or lower back. You may also: Feel confused. This may be the first symptom in older adults. Vomit. Not feel hungry. Feel tired or easily annoyed. Have a fever or chills. How is this diagnosed? A UTI is diagnosed based on your medical history  and an exam. You may also have other tests. These may include: Pee tests. Blood tests. Tests for sexually transmitted infections (STIs). If you've had more than one UTI, you may need to have imaging studies done to find out why you keep getting them. How is this treated? A UTI can be treated by: Taking antibiotics or other medicines. Drinking enough fluid to keep your pee pale yellow. In rare cases, a UTI can cause a very bad condition called sepsis. Sepsis may be treated in the hospital. Follow these instructions at home: Medicines Take your medicines only as told by your health care provider. If you were given antibiotics, take them as told by your provider. Do not stop taking them even if you start to feel better. General instructions Make sure you: Pee often and fully. Do not hold your pee for a long time. Wipe from front to back after you pee or poop. Use each tissue only once when you wipe. Pee after you have sex. Do not douche or use sprays or powders in your genital area. Contact a health care provider if: Your symptoms don't get better after 1-2 days of taking antibiotics. Your symptoms go away and then come back. You have a fever or chills. You vomit or feel like you may vomit. Get help right away if: You have very bad pain in your back or lower belly. You faint. This information is not intended to replace advice given to you by your health care provider. Make sure you discuss any questions you have with your health care provider. Document Revised: 11/18/2023 Document Reviewed: 03/13/2023 Elsevier Patient Education  [employer].   If you have been instructed to have an in-person evaluation today at a local Urgent Care facility, please use the link below. It will take you to a list of all of our available [facility], including address, phone number and hours of operation. Please do not delay care.  [facility]  If you or a family member  do not have a primary care provider, use the link below to schedule a visit and establish care. When you choose a [facility] primary care physician or advanced practice provider, you gain a long-term partner in health. Find a Primary Care Provider  Learn more about [facility] in-office and virtual care options: [facility] - Get Care Now

## 2024-06-17 NOTE — Progress Notes (Signed)
 Virtual Visit Consent   Lauren Curry, you are scheduled for a virtual visit with a [facility] provider today. Just as with appointments in the office, your consent must be obtained to participate. Your consent will be active for this visit and any virtual visit you may have with one of our providers in the next 365 days. If you have a MyChart account, a copy of this consent can be sent to you electronically.  As this is a virtual visit, video technology does not allow for your provider to perform a traditional examination. This may limit your provider's ability to fully assess your condition. If your provider identifies any concerns that need to be evaluated in person or the need to arrange testing (such as labs, EKG, etc.), we will make arrangements to do so. Although advances in technology are sophisticated, we cannot ensure that it will always work on either your end or our end. If the connection with a video visit is poor, the visit may have to be switched to a telephone visit. With either a video or telephone visit, we are not always able to ensure that we have a secure connection.  By engaging in this virtual visit, you consent to the provision of healthcare and authorize for your insurance to be billed (if applicable) for the services provided during this visit. Depending on your insurance coverage, you may receive a charge related to this service.  I need to obtain your verbal consent now. Are you willing to proceed with your visit today? Lauren Curry has provided verbal consent on 06/17/2024 for a virtual visit (video or telephone). [name], PA-C  Date: 06/17/2024 9:32 AM   Virtual Visit via Video Note   I, [name], connected with  Lauren Curry  ([id number], 14-Sep-1961) on 06/17/24 at  8:15 AM EDT by a video-enabled telemedicine application and verified that I am speaking with the correct person using two identifiers.  Location: Patient: Virtual Visit  Location Patient: Home Provider: Virtual Visit Location Provider: Home Office   I discussed the limitations of evaluation and management by telemedicine and the availability of in person appointments. The patient expressed understanding and agreed to proceed.    History of Present Illness: Lauren Curry is a 63 y.o. who identifies as a female who was assigned female at birth, and is being seen today for recurrent UTI. Was seen Virtually on 06/11/24 and started on Macrobid . Did have symptoms improve, but last night started to have burning at the end of urination again. Does have 1 tablet left of Macrobid  for this evening. Took the morning dose already. Denies bladder spasm/pain, hematuria, flank pain, nausea, vomiting, fever.  BP: 148/99  PMH: MS   Problems: There are no active problems to display for this patient.   Allergies: No Known Allergies Medications:  Current Outpatient Medications:    acetaminophen  (TYLENOL  8 HOUR) 650 MG CR tablet, Take 1 tablet (650 mg total) by mouth every 8 (eight) hours as needed for pain., Disp: 90 tablet, Rfl: 0   aspirin 81 MG EC tablet, Take 1 tablet by mouth daily., Disp: , Rfl:    benzonatate  (TESSALON ) 100 MG capsule, Take 1 capsule (100 mg total) by mouth 3 (three) times daily as needed., Disp: 30 capsule, Rfl: 0   KESIMPTA 20 MG/0.4ML SOAJ, Inject into the skin every 30 (thirty) days., Disp: , Rfl:    nitrofurantoin , macrocrystal-monohydrate, (MACROBID ) 100 MG capsule, Take 1 capsule (100 mg total) by mouth 2 (two) times daily  for 7 days., Disp: 14 capsule, Rfl: 0   sulfamethoxazole-trimethoprim (BACTRIM DS) 800-160 MG tablet, Take 1 tablet by mouth 2 (two) times daily., Disp: 10 tablet, Rfl: 0   triamterene-hydrochlorothiazide (DYAZIDE) 37.5-25 MG capsule, Take 1 capsule by mouth daily., Disp: , Rfl:   Observations/Objective: Patient is well-developed, well-nourished in no acute distress.  Resting comfortably at home.  Head is normocephalic,  atraumatic.  No labored breathing.  Speech is clear and coherent with logical content.  Patient is alert and oriented at baseline.    Assessment and Plan: 1. Suspected UTI (Primary) - sulfamethoxazole-trimethoprim (BACTRIM DS) 800-160 MG tablet; Take 1 tablet by mouth 2 (two) times daily.  Dispense: 10 tablet; Refill: 0  - Worsening symptoms despite Macrobid  - Patient home bound with MS and has no current PCP  - Will treat empirically with Bactrim - May use AZO for bladder spasms - Continue to push fluids.  - Patient does have a HH service that could get a culture but needs an order, however, patient does not have an established PCP in [place] yet. Discussed link in AVS to establish with a new PCP - Seek in person evaluation for urine culture if symptoms do not improve or if they worsen.    Follow Up Instructions: I discussed the assessment and treatment plan with the patient. The patient was provided an opportunity to ask questions and all were answered. The patient agreed with the plan and demonstrated an understanding of the instructions.  A copy of instructions were sent to the patient via MyChart unless otherwise noted below.    The patient was advised to call back or seek an in-person evaluation if the symptoms worsen or if the condition fails to improve as anticipated.     [name], PA-C

## 2024-06-18 ENCOUNTER — Telehealth: Admitting: Family Medicine

## 2024-06-18 DIAGNOSIS — R3989 Other symptoms and signs involving the genitourinary system: Secondary | ICD-10-CM

## 2024-06-18 DIAGNOSIS — T7840XA Allergy, unspecified, initial encounter: Secondary | ICD-10-CM | POA: Diagnosis not present

## 2024-06-18 MED ORDER — CEPHALEXIN 500 MG PO CAPS: 500.0000 mg | ORAL_CAPSULE | Freq: Two times a day (BID) | ORAL | 0 refills | Status: AC

## 2024-06-18 MED ORDER — DIPHENHYDRAMINE HCL 50 MG PO TABS: 50.0000 mg | ORAL_TABLET | Freq: Three times a day (TID) | ORAL | 0 refills | Status: DC | PRN

## 2024-06-18 NOTE — Patient Instructions (Signed)
 [name], thank you for joining [name], PA-C for today's virtual visit.  While this provider is not your primary care provider (PCP), if your PCP is located in our provider database this encounter information will be shared with them immediately following your visit.   A [facility] MyChart account gives you access to today's visit and all your visits, tests, and labs performed at [facility]  click here if you don't have a [facility] MyChart account or go to [email or web address]  Consent: (Patient) Lauren Curry provided verbal consent for this virtual visit at the beginning of the encounter.  Current Medications:  Current Outpatient Medications:    cephALEXin (KEFLEX) 500 MG capsule, Take 1 capsule (500 mg total) by mouth 2 (two) times daily for 5 days., Disp: 10 capsule, Rfl: 0   diphenhydrAMINE (BENADRYL) 50 MG tablet, Take 1 tablet (50 mg total) by mouth every 8 (eight) hours as needed for up to 3 days for itching., Disp: 9 tablet, Rfl: 0   acetaminophen  (TYLENOL  8 HOUR) 650 MG CR tablet, Take 1 tablet (650 mg total) by mouth every 8 (eight) hours as needed for pain., Disp: 90 tablet, Rfl: 0   aspirin 81 MG EC tablet, Take 1 tablet by mouth daily., Disp: , Rfl:    benzonatate  (TESSALON ) 100 MG capsule, Take 1 capsule (100 mg total) by mouth 3 (three) times daily as needed., Disp: 30 capsule, Rfl: 0   KESIMPTA 20 MG/0.4ML SOAJ, Inject into the skin every 30 (thirty) days., Disp: , Rfl:    nitrofurantoin , macrocrystal-monohydrate, (MACROBID ) 100 MG capsule, Take 1 capsule (100 mg total) by mouth 2 (two) times daily for 7 days., Disp: 14 capsule, Rfl: 0   triamterene-hydrochlorothiazide (DYAZIDE) 37.5-25 MG capsule, Take 1 capsule by mouth daily., Disp: , Rfl:    Medications ordered in this encounter:  Meds ordered this encounter  Medications   cephALEXin (KEFLEX) 500 MG capsule    Sig: Take 1 capsule (500 mg total) by mouth 2 (two) times daily for 5 days.     Dispense:  10 capsule    Refill:  0   diphenhydrAMINE (BENADRYL) 50 MG tablet    Sig: Take 1 tablet (50 mg total) by mouth every 8 (eight) hours as needed for up to 3 days for itching.    Dispense:  9 tablet    Refill:  0     *If you need refills on other medications prior to your next appointment, please contact your pharmacy*  Follow-Up: Call back or seek an in-person evaluation if the symptoms worsen or if the condition fails to improve as anticipated.  [facility] [phone]  Other Instructions Urinary Tract Infection, Female A urinary tract infection (UTI) is an infection in your urinary tract. The urinary tract is made up of organs that make, store, and get rid of pee (urine) in your body. These organs include: The kidneys. The ureters. The bladder. The urethra. What are the causes? Most UTIs are caused by germs called bacteria. They may be in or near your genitals. These germs grow and cause swelling in your urinary tract. What increases the risk? You're more likely to get a UTI if: You're a female. The urethra is shorter in females than in males. You have a soft tube called a catheter that drains your pee. You can't control when you pee or poop. You have trouble peeing because of: A kidney stone. A urinary blockage. A nerve condition that affects your bladder. Not  getting enough to drink. You're sexually active. You use a birth control inside your vagina, like spermicide. You're pregnant. You have low levels of the hormone estrogen in your body. You're an older adult. You're also more likely to get a UTI if you have other health problems. These may include: Diabetes. A weak immune system. Your immune system is your body's defense system. Sickle cell disease. Injury of the spine. What are the signs or symptoms? Symptoms may include: Needing to pee right away. Peeing small amounts often. Pain or burning when you pee. Blood in your pee. Pee  that smells bad or odd. Pain in your belly or lower back. You may also: Feel confused. This may be the first symptom in older adults. Vomit. Not feel hungry. Feel tired or easily annoyed. Have a fever or chills. How is this diagnosed? A UTI is diagnosed based on your medical history and an exam. You may also have other tests. These may include: Pee tests. Blood tests. Tests for sexually transmitted infections (STIs). If you've had more than one UTI, you may need to have imaging studies done to find out why you keep getting them. How is this treated? A UTI can be treated by: Taking antibiotics or other medicines. Drinking enough fluid to keep your pee pale yellow. In rare cases, a UTI can cause a very bad condition called sepsis. Sepsis may be treated in the hospital. Follow these instructions at home: Medicines Take your medicines only as told by your health care provider. If you were given antibiotics, take them as told by your provider. Do not stop taking them even if you start to feel better. General instructions Make sure you: Pee often and fully. Do not hold your pee for a long time. Wipe from front to back after you pee or poop. Use each tissue only once when you wipe. Pee after you have sex. Do not douche or use sprays or powders in your genital area. Contact a health care provider if: Your symptoms don't get better after 1-2 days of taking antibiotics. Your symptoms go away and then come back. You have a fever or chills. You vomit or feel like you may vomit. Get help right away if: You have very bad pain in your back or lower belly. You faint. This information is not intended to replace advice given to you by your health care provider. Make sure you discuss any questions you have with your health care provider. Document Revised: 11/18/2023 Document Reviewed: 03/13/2023 Elsevier Patient Education  [employer].   If you have been instructed to have an in-person  evaluation today at a local Urgent Care facility, please use the link below. It will take you to a list of all of our available [facility], including address, phone number and hours of operation. Please do not delay care.  [facility]  If you or a family member do not have a primary care provider, use the link below to schedule a visit and establish care. When you choose a [facility] primary care physician or advanced practice provider, you gain a long-term partner in health. Find a Primary Care Provider  Learn more about 's in-office and virtual care options:  - Get Care Now

## 2024-06-18 NOTE — Progress Notes (Signed)
 Virtual Visit Consent   Lauren Curry, you are scheduled for a virtual visit with a [facility] provider today. Just as with appointments in the office, your consent must be obtained to participate. Your consent will be active for this visit and any virtual visit you may have with one of our providers in the next 365 days. If you have a MyChart account, a copy of this consent can be sent to you electronically.  As this is a virtual visit, video technology does not allow for your provider to perform a traditional examination. This may limit your provider's ability to fully assess your condition. If your provider identifies any concerns that need to be evaluated in person or the need to arrange testing (such as labs, EKG, etc.), we will make arrangements to do so. Although advances in technology are sophisticated, we cannot ensure that it will always work on either your end or our end. If the connection with a video visit is poor, the visit may have to be switched to a telephone visit. With either a video or telephone visit, we are not always able to ensure that we have a secure connection.  By engaging in this virtual visit, you consent to the provision of healthcare and authorize for your insurance to be billed (if applicable) for the services provided during this visit. Depending on your insurance coverage, you may receive a charge related to this service.  I need to obtain your verbal consent now. Are you willing to proceed with your visit today? Ytzel Gubler has provided verbal consent on 06/18/2024 for a virtual visit (video or telephone). Lauren Curry, [place]  Date: 06/18/2024 2:52 PM   Virtual Visit via Video Note   I, Lauren Curry, connected with  Amaal Dimartino  ([id number], 07/30/1961) on 06/18/24 at  2:45 PM EDT by a video-enabled telemedicine application and verified that I am speaking with the correct person using two identifiers.  Location: Patient: Virtual Visit Location Patient:  Home Provider: Virtual Visit Location Provider: Home Office   I discussed the limitations of evaluation and management by telemedicine and the availability of in person appointments. The patient expressed understanding and agreed to proceed.    History of Present Illness: Lauren Curry is a 63 y.o. who identifies as a female who was assigned female at birth, and is being seen today for c/o yesterday she received a prescription for a UTI and had a rash break out from taking Bactrim and the week before she was taking Macrobid  but was not enough and she was prescribed Bactrim.  Pt states was had the rash all over her body, legs and face.  Pt denies issues with breathing and she took two Benadryl and rash went away. Pt is requesting another antibiotic because she still has burning with urination and would like a script for benadryl as well.   HPI: HPI  Problems: There are no active problems to display for this patient.   Allergies:  Allergies  Allergen Reactions   Bactrim [Sulfamethoxazole-Trimethoprim] Rash   Medications:  Current Outpatient Medications:    cephALEXin (KEFLEX) 500 MG capsule, Take 1 capsule (500 mg total) by mouth 2 (two) times daily for 5 days., Disp: 10 capsule, Rfl: 0   diphenhydrAMINE (BENADRYL) 50 MG tablet, Take 1 tablet (50 mg total) by mouth every 8 (eight) hours as needed for up to 3 days for itching., Disp: 9 tablet, Rfl: 0   acetaminophen  (TYLENOL  8 HOUR) 650 MG CR tablet, Take 1 tablet (650 mg  total) by mouth every 8 (eight) hours as needed for pain., Disp: 90 tablet, Rfl: 0   aspirin 81 MG EC tablet, Take 1 tablet by mouth daily., Disp: , Rfl:    benzonatate  (TESSALON ) 100 MG capsule, Take 1 capsule (100 mg total) by mouth 3 (three) times daily as needed., Disp: 30 capsule, Rfl: 0   KESIMPTA 20 MG/0.4ML SOAJ, Inject into the skin every 30 (thirty) days., Disp: , Rfl:    nitrofurantoin , macrocrystal-monohydrate, (MACROBID ) 100 MG capsule, Take 1 capsule (100 mg  total) by mouth 2 (two) times daily for 7 days., Disp: 14 capsule, Rfl: 0   triamterene-hydrochlorothiazide (DYAZIDE) 37.5-25 MG capsule, Take 1 capsule by mouth daily., Disp: , Rfl:   Observations/Objective: Patient is well-developed, well-nourished in no acute distress.  Resting comfortably at home.  Head is normocephalic, atraumatic.  No labored breathing.  Speech is clear and coherent with logical content.  Patient is alert and oriented at baseline.    Assessment and Plan: 1. Suspected UTI (Primary) - cephALEXin (KEFLEX) 500 MG capsule; Take 1 capsule (500 mg total) by mouth 2 (two) times daily for 5 days.  Dispense: 10 capsule; Refill: 0  2. Allergic reaction to drug, initial encounter - diphenhydrAMINE (BENADRYL) 50 MG tablet; Take 1 tablet (50 mg total) by mouth every 8 (eight) hours as needed for up to 3 days for itching.  Dispense: 9 tablet; Refill: 0  -Advised Pt if urinary symptoms persist, she is to follow up in person with PCP or urgent care -Updated allergy list to include Bactrim.  Pt to stop medication completely.  Follow Up Instructions: I discussed the assessment and treatment plan with the patient. The patient was provided an opportunity to ask questions and all were answered. The patient agreed with the plan and demonstrated an understanding of the instructions.  A copy of instructions were sent to the patient via MyChart unless otherwise noted below.    The patient was advised to call back or seek an in-person evaluation if the symptoms worsen or if the condition fails to improve as anticipated.    Lauren Mater, PA-C

## 2024-06-20 DIAGNOSIS — D849 Immunodeficiency, unspecified: Secondary | ICD-10-CM | POA: Diagnosis not present

## 2024-06-20 DIAGNOSIS — N390 Urinary tract infection, site not specified: Secondary | ICD-10-CM | POA: Diagnosis not present

## 2024-06-20 DIAGNOSIS — G35 Multiple sclerosis: Secondary | ICD-10-CM | POA: Diagnosis not present

## 2024-06-22 DIAGNOSIS — Z87440 Personal history of urinary (tract) infections: Secondary | ICD-10-CM | POA: Diagnosis not present

## 2024-06-22 DIAGNOSIS — E559 Vitamin D deficiency, unspecified: Secondary | ICD-10-CM | POA: Diagnosis not present

## 2024-06-22 DIAGNOSIS — K219 Gastro-esophageal reflux disease without esophagitis: Secondary | ICD-10-CM | POA: Diagnosis not present

## 2024-06-22 DIAGNOSIS — I10 Essential (primary) hypertension: Secondary | ICD-10-CM | POA: Diagnosis not present

## 2024-06-22 DIAGNOSIS — D849 Immunodeficiency, unspecified: Secondary | ICD-10-CM | POA: Diagnosis not present

## 2024-06-22 DIAGNOSIS — G8220 Paraplegia, unspecified: Secondary | ICD-10-CM | POA: Diagnosis not present

## 2024-06-22 DIAGNOSIS — G35 Multiple sclerosis: Secondary | ICD-10-CM | POA: Diagnosis not present

## 2024-06-22 DIAGNOSIS — G373 Acute transverse myelitis in demyelinating disease of central nervous system: Secondary | ICD-10-CM | POA: Diagnosis not present

## 2024-06-27 DIAGNOSIS — G8220 Paraplegia, unspecified: Secondary | ICD-10-CM | POA: Diagnosis not present

## 2024-06-27 DIAGNOSIS — G35 Multiple sclerosis: Secondary | ICD-10-CM | POA: Diagnosis not present

## 2024-06-27 DIAGNOSIS — G373 Acute transverse myelitis in demyelinating disease of central nervous system: Secondary | ICD-10-CM | POA: Diagnosis not present

## 2024-06-27 DIAGNOSIS — E559 Vitamin D deficiency, unspecified: Secondary | ICD-10-CM | POA: Diagnosis not present

## 2024-06-27 DIAGNOSIS — Z87440 Personal history of urinary (tract) infections: Secondary | ICD-10-CM | POA: Diagnosis not present

## 2024-06-27 DIAGNOSIS — K219 Gastro-esophageal reflux disease without esophagitis: Secondary | ICD-10-CM | POA: Diagnosis not present

## 2024-06-27 DIAGNOSIS — I10 Essential (primary) hypertension: Secondary | ICD-10-CM | POA: Diagnosis not present

## 2024-06-27 DIAGNOSIS — D849 Immunodeficiency, unspecified: Secondary | ICD-10-CM | POA: Diagnosis not present

## 2024-06-28 DIAGNOSIS — Z87440 Personal history of urinary (tract) infections: Secondary | ICD-10-CM | POA: Diagnosis not present

## 2024-06-28 DIAGNOSIS — E559 Vitamin D deficiency, unspecified: Secondary | ICD-10-CM | POA: Diagnosis not present

## 2024-06-28 DIAGNOSIS — I10 Essential (primary) hypertension: Secondary | ICD-10-CM | POA: Diagnosis not present

## 2024-06-28 DIAGNOSIS — D849 Immunodeficiency, unspecified: Secondary | ICD-10-CM | POA: Diagnosis not present

## 2024-06-28 DIAGNOSIS — G35 Multiple sclerosis: Secondary | ICD-10-CM | POA: Diagnosis not present

## 2024-06-28 DIAGNOSIS — K219 Gastro-esophageal reflux disease without esophagitis: Secondary | ICD-10-CM | POA: Diagnosis not present

## 2024-06-28 DIAGNOSIS — G373 Acute transverse myelitis in demyelinating disease of central nervous system: Secondary | ICD-10-CM | POA: Diagnosis not present

## 2024-06-28 DIAGNOSIS — G8220 Paraplegia, unspecified: Secondary | ICD-10-CM | POA: Diagnosis not present

## 2024-06-29 DIAGNOSIS — E559 Vitamin D deficiency, unspecified: Secondary | ICD-10-CM | POA: Diagnosis not present

## 2024-06-29 DIAGNOSIS — D849 Immunodeficiency, unspecified: Secondary | ICD-10-CM | POA: Diagnosis not present

## 2024-06-29 DIAGNOSIS — G8220 Paraplegia, unspecified: Secondary | ICD-10-CM | POA: Diagnosis not present

## 2024-06-29 DIAGNOSIS — K219 Gastro-esophageal reflux disease without esophagitis: Secondary | ICD-10-CM | POA: Diagnosis not present

## 2024-06-29 DIAGNOSIS — G35 Multiple sclerosis: Secondary | ICD-10-CM | POA: Diagnosis not present

## 2024-06-29 DIAGNOSIS — Z87440 Personal history of urinary (tract) infections: Secondary | ICD-10-CM | POA: Diagnosis not present

## 2024-06-29 DIAGNOSIS — G373 Acute transverse myelitis in demyelinating disease of central nervous system: Secondary | ICD-10-CM | POA: Diagnosis not present

## 2024-06-29 DIAGNOSIS — I10 Essential (primary) hypertension: Secondary | ICD-10-CM | POA: Diagnosis not present

## 2024-07-01 ENCOUNTER — Other Ambulatory Visit (HOSPITAL_COMMUNITY): Payer: Self-pay | Admitting: Physician Assistant

## 2024-07-01 DIAGNOSIS — G35 Multiple sclerosis: Secondary | ICD-10-CM

## 2024-07-07 DIAGNOSIS — D849 Immunodeficiency, unspecified: Secondary | ICD-10-CM | POA: Diagnosis not present

## 2024-07-07 DIAGNOSIS — I10 Essential (primary) hypertension: Secondary | ICD-10-CM | POA: Diagnosis not present

## 2024-07-07 DIAGNOSIS — E559 Vitamin D deficiency, unspecified: Secondary | ICD-10-CM | POA: Diagnosis not present

## 2024-07-07 DIAGNOSIS — K219 Gastro-esophageal reflux disease without esophagitis: Secondary | ICD-10-CM | POA: Diagnosis not present

## 2024-07-07 DIAGNOSIS — G373 Acute transverse myelitis in demyelinating disease of central nervous system: Secondary | ICD-10-CM | POA: Diagnosis not present

## 2024-07-07 DIAGNOSIS — Z87440 Personal history of urinary (tract) infections: Secondary | ICD-10-CM | POA: Diagnosis not present

## 2024-07-07 DIAGNOSIS — G8220 Paraplegia, unspecified: Secondary | ICD-10-CM | POA: Diagnosis not present

## 2024-07-07 DIAGNOSIS — G35 Multiple sclerosis: Secondary | ICD-10-CM | POA: Diagnosis not present

## 2024-07-08 DIAGNOSIS — I10 Essential (primary) hypertension: Secondary | ICD-10-CM | POA: Diagnosis not present

## 2024-07-08 DIAGNOSIS — D849 Immunodeficiency, unspecified: Secondary | ICD-10-CM | POA: Diagnosis not present

## 2024-07-08 DIAGNOSIS — G35 Multiple sclerosis: Secondary | ICD-10-CM | POA: Diagnosis not present

## 2024-07-08 DIAGNOSIS — G373 Acute transverse myelitis in demyelinating disease of central nervous system: Secondary | ICD-10-CM | POA: Diagnosis not present

## 2024-07-08 DIAGNOSIS — Z87440 Personal history of urinary (tract) infections: Secondary | ICD-10-CM | POA: Diagnosis not present

## 2024-07-08 DIAGNOSIS — K219 Gastro-esophageal reflux disease without esophagitis: Secondary | ICD-10-CM | POA: Diagnosis not present

## 2024-07-08 DIAGNOSIS — E559 Vitamin D deficiency, unspecified: Secondary | ICD-10-CM | POA: Diagnosis not present

## 2024-07-08 DIAGNOSIS — G8220 Paraplegia, unspecified: Secondary | ICD-10-CM | POA: Diagnosis not present

## 2024-07-12 DIAGNOSIS — K219 Gastro-esophageal reflux disease without esophagitis: Secondary | ICD-10-CM | POA: Diagnosis not present

## 2024-07-12 DIAGNOSIS — G35 Multiple sclerosis: Secondary | ICD-10-CM | POA: Diagnosis not present

## 2024-07-12 DIAGNOSIS — E559 Vitamin D deficiency, unspecified: Secondary | ICD-10-CM | POA: Diagnosis not present

## 2024-07-12 DIAGNOSIS — I10 Essential (primary) hypertension: Secondary | ICD-10-CM | POA: Diagnosis not present

## 2024-07-12 DIAGNOSIS — Z87440 Personal history of urinary (tract) infections: Secondary | ICD-10-CM | POA: Diagnosis not present

## 2024-07-12 DIAGNOSIS — G8220 Paraplegia, unspecified: Secondary | ICD-10-CM | POA: Diagnosis not present

## 2024-07-12 DIAGNOSIS — G373 Acute transverse myelitis in demyelinating disease of central nervous system: Secondary | ICD-10-CM | POA: Diagnosis not present

## 2024-07-12 DIAGNOSIS — D849 Immunodeficiency, unspecified: Secondary | ICD-10-CM | POA: Diagnosis not present

## 2024-07-13 DIAGNOSIS — D849 Immunodeficiency, unspecified: Secondary | ICD-10-CM | POA: Diagnosis not present

## 2024-07-13 DIAGNOSIS — E559 Vitamin D deficiency, unspecified: Secondary | ICD-10-CM | POA: Diagnosis not present

## 2024-07-13 DIAGNOSIS — Z87440 Personal history of urinary (tract) infections: Secondary | ICD-10-CM | POA: Diagnosis not present

## 2024-07-13 DIAGNOSIS — I10 Essential (primary) hypertension: Secondary | ICD-10-CM | POA: Diagnosis not present

## 2024-07-13 DIAGNOSIS — G35 Multiple sclerosis: Secondary | ICD-10-CM | POA: Diagnosis not present

## 2024-07-13 DIAGNOSIS — G8220 Paraplegia, unspecified: Secondary | ICD-10-CM | POA: Diagnosis not present

## 2024-07-13 DIAGNOSIS — K219 Gastro-esophageal reflux disease without esophagitis: Secondary | ICD-10-CM | POA: Diagnosis not present

## 2024-07-13 DIAGNOSIS — G373 Acute transverse myelitis in demyelinating disease of central nervous system: Secondary | ICD-10-CM | POA: Diagnosis not present

## 2024-07-20 DIAGNOSIS — E559 Vitamin D deficiency, unspecified: Secondary | ICD-10-CM | POA: Diagnosis not present

## 2024-07-20 DIAGNOSIS — K219 Gastro-esophageal reflux disease without esophagitis: Secondary | ICD-10-CM | POA: Diagnosis not present

## 2024-07-20 DIAGNOSIS — G8220 Paraplegia, unspecified: Secondary | ICD-10-CM | POA: Diagnosis not present

## 2024-07-20 DIAGNOSIS — I10 Essential (primary) hypertension: Secondary | ICD-10-CM | POA: Diagnosis not present

## 2024-07-20 DIAGNOSIS — Z87440 Personal history of urinary (tract) infections: Secondary | ICD-10-CM | POA: Diagnosis not present

## 2024-07-20 DIAGNOSIS — G373 Acute transverse myelitis in demyelinating disease of central nervous system: Secondary | ICD-10-CM | POA: Diagnosis not present

## 2024-07-20 DIAGNOSIS — D849 Immunodeficiency, unspecified: Secondary | ICD-10-CM | POA: Diagnosis not present

## 2024-07-20 DIAGNOSIS — G35 Multiple sclerosis: Secondary | ICD-10-CM | POA: Diagnosis not present

## 2024-07-21 DIAGNOSIS — K219 Gastro-esophageal reflux disease without esophagitis: Secondary | ICD-10-CM | POA: Diagnosis not present

## 2024-07-21 DIAGNOSIS — G8220 Paraplegia, unspecified: Secondary | ICD-10-CM | POA: Diagnosis not present

## 2024-07-21 DIAGNOSIS — E559 Vitamin D deficiency, unspecified: Secondary | ICD-10-CM | POA: Diagnosis not present

## 2024-07-21 DIAGNOSIS — D849 Immunodeficiency, unspecified: Secondary | ICD-10-CM | POA: Diagnosis not present

## 2024-07-21 DIAGNOSIS — I10 Essential (primary) hypertension: Secondary | ICD-10-CM | POA: Diagnosis not present

## 2024-07-21 DIAGNOSIS — G35 Multiple sclerosis: Secondary | ICD-10-CM | POA: Diagnosis not present

## 2024-07-21 DIAGNOSIS — G373 Acute transverse myelitis in demyelinating disease of central nervous system: Secondary | ICD-10-CM | POA: Diagnosis not present

## 2024-07-21 DIAGNOSIS — Z87440 Personal history of urinary (tract) infections: Secondary | ICD-10-CM | POA: Diagnosis not present

## 2024-07-23 ENCOUNTER — Other Ambulatory Visit: Payer: Self-pay

## 2024-07-23 ENCOUNTER — Emergency Department (HOSPITAL_COMMUNITY)

## 2024-07-23 ENCOUNTER — Inpatient Hospital Stay (HOSPITAL_COMMUNITY)
Admission: EM | Admit: 2024-07-23 | Discharge: 2024-07-27 | DRG: 690 | Disposition: A | Discharge status: Skilled Nursing Facility | Attending: Internal Medicine | Admitting: Internal Medicine

## 2024-07-23 ENCOUNTER — Encounter (HOSPITAL_COMMUNITY): Payer: Self-pay

## 2024-07-23 ENCOUNTER — Ambulatory Visit (HOSPITAL_COMMUNITY)

## 2024-07-23 ENCOUNTER — Encounter (HOSPITAL_COMMUNITY): Payer: Self-pay | Admitting: Emergency Medicine

## 2024-07-23 DIAGNOSIS — D849 Immunodeficiency, unspecified: Secondary | ICD-10-CM | POA: Diagnosis not present

## 2024-07-23 DIAGNOSIS — N390 Urinary tract infection, site not specified: Secondary | ICD-10-CM | POA: Diagnosis not present

## 2024-07-23 DIAGNOSIS — Z7401 Bed confinement status: Secondary | ICD-10-CM

## 2024-07-23 DIAGNOSIS — Z882 Allergy status to sulfonamides status: Secondary | ICD-10-CM | POA: Diagnosis not present

## 2024-07-23 DIAGNOSIS — M50322 Other cervical disc degeneration at C5-C6 level: Secondary | ICD-10-CM | POA: Diagnosis not present

## 2024-07-23 DIAGNOSIS — R531 Weakness: Secondary | ICD-10-CM | POA: Diagnosis not present

## 2024-07-23 DIAGNOSIS — M545 Low back pain, unspecified: Secondary | ICD-10-CM | POA: Diagnosis not present

## 2024-07-23 DIAGNOSIS — M21372 Foot drop, left foot: Secondary | ICD-10-CM | POA: Diagnosis present

## 2024-07-23 DIAGNOSIS — I10 Essential (primary) hypertension: Secondary | ICD-10-CM | POA: Diagnosis not present

## 2024-07-23 DIAGNOSIS — E66813 Obesity, class 3: Secondary | ICD-10-CM | POA: Diagnosis present

## 2024-07-23 DIAGNOSIS — N3000 Acute cystitis without hematuria: Principal | ICD-10-CM | POA: Diagnosis present

## 2024-07-23 DIAGNOSIS — Z7982 Long term (current) use of aspirin: Secondary | ICD-10-CM

## 2024-07-23 DIAGNOSIS — R109 Unspecified abdominal pain: Secondary | ICD-10-CM | POA: Diagnosis not present

## 2024-07-23 DIAGNOSIS — Z8249 Family history of ischemic heart disease and other diseases of the circulatory system: Secondary | ICD-10-CM | POA: Diagnosis not present

## 2024-07-23 DIAGNOSIS — Z6841 Body Mass Index (BMI) 40.0 and over, adult: Secondary | ICD-10-CM

## 2024-07-23 DIAGNOSIS — G35 Multiple sclerosis: Secondary | ICD-10-CM | POA: Diagnosis not present

## 2024-07-23 DIAGNOSIS — M5021 Other cervical disc displacement,  high cervical region: Secondary | ICD-10-CM | POA: Diagnosis not present

## 2024-07-23 DIAGNOSIS — I159 Secondary hypertension, unspecified: Secondary | ICD-10-CM | POA: Diagnosis not present

## 2024-07-23 DIAGNOSIS — Z79899 Other long term (current) drug therapy: Secondary | ICD-10-CM | POA: Diagnosis not present

## 2024-07-23 DIAGNOSIS — Z87440 Personal history of urinary (tract) infections: Secondary | ICD-10-CM

## 2024-07-23 DIAGNOSIS — M50221 Other cervical disc displacement at C4-C5 level: Secondary | ICD-10-CM | POA: Diagnosis not present

## 2024-07-23 DIAGNOSIS — M50323 Other cervical disc degeneration at C6-C7 level: Secondary | ICD-10-CM | POA: Diagnosis not present

## 2024-07-23 DIAGNOSIS — Z90710 Acquired absence of both cervix and uterus: Secondary | ICD-10-CM

## 2024-07-23 DIAGNOSIS — R6889 Other general symptoms and signs: Secondary | ICD-10-CM | POA: Diagnosis not present

## 2024-07-23 LAB — URINALYSIS, W/ REFLEX TO CULTURE (INFECTION SUSPECTED)
Bilirubin Urine: NEGATIVE
Glucose, UA: NEGATIVE mg/dL
Hgb urine dipstick: NEGATIVE
Ketones, ur: NEGATIVE mg/dL
Nitrite: NEGATIVE
Protein, ur: 100 mg/dL — AB
Specific Gravity, Urine: 1.026 (ref 1.005–1.030)
WBC, UA: >50 WBC/hpf (ref 0–5)
pH: 6 (ref 5.0–8.0)

## 2024-07-23 LAB — CBC WITH DIFFERENTIAL/PLATELET
Abs Immature Granulocytes: 0.03 K/uL (ref 0.00–0.07)
Basophils Absolute: 0 K/uL (ref 0.0–0.1)
Basophils Relative: 0 %
Eosinophils Absolute: 0.1 K/uL (ref 0.0–0.5)
Eosinophils Relative: 1 %
HCT: 45.8 % (ref 36.0–46.0)
Hemoglobin: 14.2 g/dL (ref 12.0–15.0)
Immature Granulocytes: 0 %
Lymphocytes Relative: 14 %
Lymphs Abs: 1.5 K/uL (ref 0.7–4.0)
MCH: 27.8 pg (ref 26.0–34.0)
MCHC: 31 g/dL (ref 30.0–36.0)
MCV: 89.8 fL (ref 80.0–100.0)
Monocytes Absolute: 0.9 K/uL (ref 0.1–1.0)
Monocytes Relative: 8 %
Neutro Abs: 7.8 K/uL — ABNORMAL HIGH (ref 1.7–7.7)
Neutrophils Relative %: 77 %
Platelets: 328 K/uL (ref 150–400)
RBC: 5.1 MIL/uL (ref 3.87–5.11)
RDW: 13.7 % (ref 11.5–15.5)
WBC: 10.3 K/uL (ref 4.0–10.5)
nRBC: 0 % (ref 0.0–0.2)

## 2024-07-23 LAB — COMPREHENSIVE METABOLIC PANEL WITH GFR
ALT: 16 U/L (ref 0–44)
AST: 24 U/L (ref 15–41)
Albumin: 3.8 g/dL (ref 3.5–5.0)
Alkaline Phosphatase: 98 U/L (ref 38–126)
Anion gap: 5 (ref 5–15)
BUN: 27 mg/dL — ABNORMAL HIGH (ref 8–23)
CO2: 23 mmol/L (ref 22–32)
Calcium: 9.4 mg/dL (ref 8.9–10.3)
Chloride: 110 mmol/L (ref 98–111)
Creatinine, Ser: 0.8 mg/dL (ref 0.44–1.00)
GFR, Estimated: >60 mL/min (ref >60)
Glucose, Bld: 94 mg/dL (ref 70–99)
Potassium: 4 mmol/L (ref 3.5–5.1)
Sodium: 138 mmol/L (ref 135–145)
Total Bilirubin: 0.9 mg/dL (ref 0.0–1.2)
Total Protein: 7.4 g/dL (ref 6.5–8.1)

## 2024-07-23 LAB — LIPASE, BLOOD: Lipase: 27 U/L (ref 11–51)

## 2024-07-23 MED ORDER — POTASSIUM CHLORIDE ER 10 MEQ PO TBCR
20.0000 meq | EXTENDED_RELEASE_TABLET | Freq: Every morning | ORAL | Status: DC
  Administered 2024-07-23 – 2024-07-27 (×5): 20 meq via ORAL
  Filled 2024-07-23 (×10): qty 2

## 2024-07-23 MED ORDER — GADOBUTROL 1 MMOL/ML IV SOLN: 5.0000 mL | Freq: Once | INTRAVENOUS | Status: DC | PRN

## 2024-07-23 MED ORDER — HYDROCHLOROTHIAZIDE 12.5 MG PO TABS
12.5000 mg | ORAL_TABLET | Freq: Every day | ORAL | Status: DC
  Administered 2024-07-23 – 2024-07-27 (×5): 12.5 mg via ORAL
  Filled 2024-07-23 (×5): qty 1

## 2024-07-23 MED ORDER — ENOXAPARIN SODIUM 60 MG/0.6ML IJ SOSY
60.0000 mg | PREFILLED_SYRINGE | INTRAMUSCULAR | Status: DC
  Administered 2024-07-23 – 2024-07-27 (×5): 60 mg via SUBCUTANEOUS
  Filled 2024-07-23 (×5): qty 0.6

## 2024-07-23 MED ORDER — ALBUTEROL SULFATE (2.5 MG/3ML) 0.083% IN NEBU: 2.5000 mg | INHALATION_SOLUTION | RESPIRATORY_TRACT | Status: DC | PRN

## 2024-07-23 MED ORDER — ACETAMINOPHEN 325 MG PO TABS
650.0000 mg | ORAL_TABLET | Freq: Four times a day (QID) | ORAL | Status: DC | PRN
  Administered 2024-07-24 – 2024-07-27 (×8): 650 mg via ORAL
  Filled 2024-07-23 (×9): qty 2

## 2024-07-23 MED ORDER — TRAZODONE HCL 50 MG PO TABS
25.0000 mg | ORAL_TABLET | Freq: Every evening | ORAL | Status: DC | PRN
  Administered 2024-07-26: 25 mg via ORAL
  Filled 2024-07-23: qty 1

## 2024-07-23 MED ORDER — ONDANSETRON HCL 4 MG PO TABS: 4.0000 mg | ORAL_TABLET | Freq: Four times a day (QID) | ORAL | Status: DC | PRN

## 2024-07-23 MED ORDER — SODIUM CHLORIDE 0.9 % IV SOLN
1.0000 g | INTRAVENOUS | Status: DC
  Administered 2024-07-24 – 2024-07-26 (×3): 1 g via INTRAVENOUS
  Filled 2024-07-23 (×3): qty 10

## 2024-07-23 MED ORDER — ONDANSETRON HCL 4 MG/2ML IJ SOLN: 4.0000 mg | Freq: Four times a day (QID) | INTRAMUSCULAR | Status: DC | PRN

## 2024-07-23 MED ORDER — GADOBUTROL 1 MMOL/ML IV SOLN
10.0000 mL | Freq: Once | INTRAVENOUS | Status: AC | PRN
  Administered 2024-07-23: 10 mL via INTRAVENOUS

## 2024-07-23 MED ORDER — SODIUM CHLORIDE 0.9 % IV SOLN
2.0000 g | Freq: Once | INTRAVENOUS | Status: AC
  Administered 2024-07-23: 2 g via INTRAVENOUS
  Filled 2024-07-23: qty 20

## 2024-07-23 MED ORDER — ACETAMINOPHEN 650 MG RE SUPP: 650.0000 mg | Freq: Four times a day (QID) | RECTAL | Status: DC | PRN

## 2024-07-23 MED ORDER — ACETAMINOPHEN 325 MG PO TABS
650.0000 mg | ORAL_TABLET | Freq: Once | ORAL | Status: AC
  Administered 2024-07-23: 650 mg via ORAL
  Filled 2024-07-23: qty 2

## 2024-07-23 NOTE — ED Triage Notes (Signed)
 Pt bib EMS from home for abdominal pain and lower back pain. Pt reports recent antibiotics for UTI. Pt states she finished antibiotics last Sunday. Known MS pt. Denies N/V/D.

## 2024-07-23 NOTE — Plan of Care (Signed)
   Problem: Clinical Measurements: Goal: Diagnostic test results will improve Outcome: Progressing

## 2024-07-23 NOTE — H&P (Signed)
 History and Physical  Lauren Curry FMW:[id number] DOB: October 13, 1961 DOA: 07/23/2024  PCP: Pcp, No   Chief Complaint: Dysuria, low back pain  HPI: Lauren Curry is a 63 y.o. female with medical history significant for hypertension, MS on Kesimpta be admitted to the hospital for treatment of UTI which failed outpatient management.  She was diagnosed with UTI a couple weeks ago, presenting with bladder pressure, dysuria and and malodorous urine.  She was started on a treatment course with Bactrim , she developed a rash to this, and did not complete the treatment.  She was switched to a 7-day course of p.o. Keflex , which she finished about 1 week ago.  However all of her symptoms have continued, other than the malodorous urine which has resolved.  She developed some low back pain this morning, was feeling quite weak generally even more than is usual for her.  At baseline, she essentially tells me does not have use of her left leg.  Feeling generally weak since diagnosed with UTI but otherwise there has been no acute change in her neurologic status.  Review of Systems: Please see HPI for pertinent positives and negatives. A complete 10 system review of systems are otherwise negative.  Past Medical History:  Diagnosis Date   Hypertension    Multiple sclerosis (HCC)    Past Surgical History:  Procedure Laterality Date   ABDOMINAL HYSTERECTOMY     CHOLECYSTECTOMY     Social History:  reports that she has never smoked. She has never used smokeless tobacco. She reports that she does not drink alcohol and does not use drugs.  Allergies  Allergen Reactions   Bactrim  [Sulfamethoxazole -Trimethoprim ] Rash    Family History  Problem Relation Age of Onset   CAD Father      Prior to Admission medications   Medication Sig Start Date End Date Taking? Authorizing Provider  acetaminophen  (TYLENOL ) 650 MG CR tablet Take 1,300 mg by mouth daily as needed for pain.   Yes [provider]   hydrochlorothiazide  (HYDRODIURIL ) 12.5 MG tablet Take 12.5 mg by mouth daily.   Yes [provider]  KESIMPTA 20 MG/0.4ML SOAJ Inject 20 mg into the skin every 30 (thirty) days. 01/28/22  Yes [provider]  potassium chloride  (KLOR-CON ) 10 MEQ tablet Take 2 tablets by mouth every morning. 09/25/22  Yes [provider]  diphenhydrAMINE  (BENADRYL ) 50 MG tablet Take 1 tablet (50 mg total) by mouth every 8 (eight) hours as needed for up to 3 days for itching. Patient not taking: Reported on 07/23/2024 06/18/24 06/21/24  Kingston Robes, PA-C  MACROBID  100 MG capsule Take 1 capsule every 12 hours by oral route for 5 days. Patient not taking: Reported on 07/23/2024 10/06/23   [provider]    Physical Exam: BP (!) 174/99 (BP Location: Left Arm)   Pulse 63   Temp 98.3 F (36.8 C) (Oral)   Resp 20   Ht 5' 5 (1.651 m)   Wt 127 kg   SpO2 100%   BMI 46.59 kg/m  General:  Alert, oriented, calm, in no acute distress, looks quite comfortable and nontoxic Cardiovascular: RRR, no murmurs or rubs, no peripheral edema  Respiratory: clear to auscultation bilaterally, no wheezes, no crackles  Abdomen: soft, nontender, nondistended, normal bowel tones heard  Skin: dry, no rashes  Musculoskeletal: no joint effusions, normal range of motion  Psychiatric: appropriate affect, normal speech           Labs on Admission:  Basic Metabolic Panel: Recent  Labs  Lab 07/23/24 0850  NA 138  K 4.0  CL 110  CO2 23  GLUCOSE 94  BUN 27*  CREATININE 0.80  CALCIUM 9.4   Liver Function Tests: Recent Labs  Lab 07/23/24 0850  AST 24  ALT 16  ALKPHOS 98  BILITOT 0.9  PROT 7.4  ALBUMIN 3.8   Recent Labs  Lab 07/23/24 0850  LIPASE 27   No results for input(s): AMMONIA in the last 168 hours. CBC: Recent Labs  Lab 07/23/24 0850  WBC 10.3  NEUTROABS 7.8*  HGB 14.2  HCT 45.8  MCV 89.8  PLT 328   Cardiac Enzymes: No results for input(s): CKTOTAL, CKMB,  CKMBINDEX, TROPONINI in the last 168 hours. BNP (last 3 results) No results for input(s): BNP in the last 8760 hours.  ProBNP (last 3 results) No results for input(s): PROBNP in the last 8760 hours.  CBG: No results for input(s): GLUCAP in the last 168 hours.  Radiological Exams on Admission: No results found. Assessment/Plan Lauren Curry is a 63 y.o. female with medical history significant for hypertension, MS on Kesimpta be admitted to the hospital for treatment of UTI which failed outpatient management.  UTI-with continued dysuria, low pelvic pressure.  Urinalysis consistent with possible UTI.  No leukocytosis, no fever. -Observation admission -Empiric IV Rocephin  -Follow-up blood culture  Hypertension-continue home hydrochlorothiazide   MS-she has baseline left lower extremity weakness which she states is unchanged.  She was scheduled for an outpatient [facility] at [facility] today, this has been completed and will be followed by her neurologist.  DVT prophylaxis: Lovenox      Code Status: Full Code  Consults called: None  Admission status: Observation  Time spent: 46 minutes  Kekoa Fyock CHRISTELLA Gail MD [facility] Pager [phone]  If 7PM-7AM, please contact night-coverage [email or web address] Password [email or web address]  07/23/2024, 2:07 PM

## 2024-07-23 NOTE — ED Provider Notes (Signed)
 [facility] AT [facility] Provider Note   CSN: [id number] Arrival date & time: 07/23/24  9242     Patient presents with: Abdominal Pain and Back Pain   Lauren Curry is a 63 y.o. female.   63 year old female with a past medical history of MS on Kesimpta presents to the ED via EMS with a chief complaint of lower back pain along with urinary symptoms for the past week.  According to patient, she was recently treated for a urinary tract infection.  She was previously placed on Bactrim  which she developed a rash to.  Therefore she was given a 7-day course of Keflex  which she finished about a week ago.  She continues to have some pressure whenever she is done voiding.  She reports she feels that at the end of her stream pressure this takes over suprapubic area.  She does not have any foul odor anymore to her urine.  She also tells me she is having some low back pain, she does not ambulate at baseline and uses a wheelchair for transfer due to a prior history of left foot drop.  Denies any new neurological changes.  She does have an MRI brain and lumbar spine scheduled at [facility] today, but will not be able to attend this.  No nausea, no fever, no abdominal pain, no other complaints reported.   The history is provided by the patient.  Abdominal Pain Associated symptoms: no chest pain, no chills, no fever, no nausea, no shortness of breath, no sore throat and no vomiting   Back Pain Associated symptoms: abdominal pain   Associated symptoms: no chest pain and no fever        Prior to Admission medications   Medication Sig Start Date End Date Taking? Authorizing Provider  MACROBID  100 MG capsule Take 1 capsule every 12 hours by oral route for 5 days. 10/06/23  Yes [provider]  potassium chloride  (KLOR-CON ) 10 MEQ tablet Take 2 tablets by mouth every morning. 09/25/22  Yes [provider]  aspirin 81 MG EC tablet Take 1 tablet by mouth  daily. 06/04/20   [provider]  benzonatate  (TESSALON ) 100 MG capsule Take 1 capsule (100 mg total) by mouth 3 (three) times daily as needed. 07/13/23   Kennyth Domino, FNP  diphenhydrAMINE  (BENADRYL ) 50 MG tablet Take 1 tablet (50 mg total) by mouth every 8 (eight) hours as needed for up to 3 days for itching. 06/18/24 06/21/24  Kingston Robes, PA-C  hydrochlorothiazide  (HYDRODIURIL ) 12.5 MG tablet Take 12.5 mg by mouth daily.    [provider]  KESIMPTA 20 MG/0.4ML SOAJ Inject into the skin every 30 (thirty) days. 01/28/22   [provider]  triamterene-hydrochlorothiazide  (DYAZIDE) 37.5-25 MG capsule Take 1 capsule by mouth daily.    [provider]    Allergies: Bactrim  [sulfamethoxazole -trimethoprim ]    Review of Systems  Constitutional:  Negative for chills and fever.  HENT:  Negative for sore throat.   Respiratory:  Negative for shortness of breath.   Cardiovascular:  Negative for chest pain.  Gastrointestinal:  Positive for abdominal pain. Negative for nausea and vomiting.  Musculoskeletal:  Positive for back pain.  All other systems reviewed and are negative.   Updated Vital Signs BP (!) 174/99 (BP Location: Left Arm)   Pulse 63   Temp 98.3 F (36.8 C) (Oral)   Resp 20   Ht 5' 5 (1.651 m)   Wt 127 kg   SpO2 100%  BMI 46.59 kg/m   Physical Exam Vitals and nursing note reviewed.  Constitutional:      Appearance: She is well-developed.  HENT:     Head: Normocephalic and atraumatic.  Cardiovascular:     Rate and Rhythm: Normal rate.  Pulmonary:     Effort: Pulmonary effort is normal.  Abdominal:     General: Abdomen is flat. Bowel sounds are normal.     Palpations: Abdomen is soft.     Tenderness: There is abdominal tenderness in the suprapubic area. There is no right CVA tenderness or left CVA tenderness.  Skin:    General: Skin is warm and dry.  Neurological:     Mental Status: She is alert.     Motor: Weakness present.      Comments: LEFT FOOT DROP, at baseline per patient Unable to move it on exam.      (all labs ordered are listed, but only abnormal results are displayed) Labs Reviewed  URINALYSIS, W/ REFLEX TO CULTURE (INFECTION SUSPECTED) - Abnormal; Notable for the following components:      Result Value   Color, Urine AMBER (*)    APPearance HAZY (*)    Protein, ur 100 (*)    Leukocytes,Ua LARGE (*)    Bacteria, UA MANY (*)    All other components within normal limits  CBC WITH DIFFERENTIAL/PLATELET - Abnormal; Notable for the following components:   Neutro Abs 7.8 (*)    All other components within normal limits  COMPREHENSIVE METABOLIC PANEL WITH GFR - Abnormal; Notable for the following components:   BUN 27 (*)    All other components within normal limits  URINE CULTURE  LIPASE, BLOOD    EKG: None  Radiology: No results found.   Procedures   Medications Ordered in the ED  gadobutrol  (GADAVIST ) 1 MMOL/ML injection 5 mL (has no administration in time range)  cefTRIAXone  (ROCEPHIN ) 2 g in sodium chloride  0.9 % 100 mL IVPB (0 g Intravenous Stopped 07/23/24 1145)  acetaminophen  (TYLENOL ) tablet 650 mg (650 mg Oral Given 07/23/24 1103)    Clinical Course as of 07/23/24 1237  Sat Jul 23, 2024  1028 Leukocytes,Ua(!): LARGE [JS]  1028 WBC, UA: >50 [JS]  1028 Bacteria, UA(!): MANY [JS]    Clinical Course User Index [JS] Zamaria Brazzle, PA-C                                 Medical Decision Making Amount and/or Complexity of Data Reviewed Labs: ordered. Decision-making details documented in ED Course. Radiology: ordered.  Risk OTC drugs. Prescription drug management.   This patient presents to the ED for concern of UTI, this involves a number of treatment options, and is a complaint that carries with it a high risk of complications and morbidity.  The differential diagnosis includes infection versus obstruction.    Co morbidities: Discussed in HPI   Brief History:  See HPI.    EMR reviewed including pt PMHx, past surgical history and past visits to ER.   See HPI for more details   Lab Tests:  I ordered and independently interpreted labs.  The pertinent results include:    I personally reviewed all laboratory work and imaging. Metabolic panel without any acute abnormality specifically kidney function within normal limits and no significant electrolyte abnormalities. CBC without leukocytosis or significant anemia. UA with greater than 50 white blood cell count, many bacteria, large leukocytes and some protein present.  Imaging Studies:  Patient did have an MRI scheduled of her head and her cervical spine to monitor her MS today at [facility], she is wondering if we could.  Medicines ordered:  I ordered medication including Tylenol  for headache Reevaluation of the patient after these medicines showed that the patient improved I have reviewed the patients home medicines and have made adjustments as needed   Critical Interventions:  Given 2 g of Rocephin  for urinary tract infection.  Reevaluation:  After the interventions noted above I re-evaluated patient and found that they have :stayed the same  Social Determinants of Health:  The patient's social determinants of health were a factor in the care of this patient  Problem List / ED Course:  Patient presents to the ED with a chief complaint of suprapubic pain with been ongoing for approximately 1 week.  She was previously prescribed Bactrim  to help with a diagnosed UTI via telehealth.  She reports she developed a rash to the Bactrim , therefore her antibiotic was switched to Keflex .  She reports she finished an entire 7-day course of Keflex .  She continues to have suprapubic pressure, states that anytime she tries to void afterwards she feels like she has not fully emptying and there is a lot of pressure on her abdomen.  On today's arrival there is some tenderness to palpation along the suprapubic area.   No CVA tenderness noted.  She does have ongoing low back pain. Labs on today's visit showed a CBC with no leukocytosis, hemoglobin is unremarkable.  CMP without any electrolyte derangement, creatinine level is normal, however BUN is slightly elevated.  LFTs are within normal limits and lipase levels normal.  UA does show greater than 50 white blood cell count, large leukocytes, many bacteria this was sent out for urine sure.  I do feel that patient finishing a 7-day course of oral antibiotics should have some improvement in her pain along with her urine sample.  I do feel that she likely failed outpatient treatment at this time.  She is currently immunosuppressed on Kesimpta for her MS, I do feel that she warrants admission at this time. She was given 2 g of IV Rocephin  while in the emergency department.  She also tells me that she wants to see if we could do the MRI that she missed this morning over at [facility]  Of note, patient does have a history of left foot drop, this has not worsened since her prior visit.  However, she does report that having this ongoing factors makes her feel like she is generally weaker than her baseline.   Dispostion:  After consideration of the diagnostic results and the patients response to treatment, I feel that the patent would benefit from admission for further management of UTI after outpatient treatment failure on immunosuppressed patient.    Portions of this note were generated with Scientist, clinical (histocompatibility and immunogenetics). Dictation errors may occur despite best attempts at proofreading.      Final diagnoses:  Lower urinary tract infectious disease    ED Discharge Orders     None          Dashawn Golda, PA-C 07/23/24 1238    Patsey Lot, MD 07/24/24 [phone]

## 2024-07-24 DIAGNOSIS — M545 Low back pain, unspecified: Secondary | ICD-10-CM | POA: Diagnosis present

## 2024-07-24 DIAGNOSIS — Z87440 Personal history of urinary (tract) infections: Secondary | ICD-10-CM | POA: Diagnosis not present

## 2024-07-24 DIAGNOSIS — N3000 Acute cystitis without hematuria: Secondary | ICD-10-CM

## 2024-07-24 DIAGNOSIS — E66813 Obesity, class 3: Secondary | ICD-10-CM | POA: Diagnosis present

## 2024-07-24 DIAGNOSIS — I159 Secondary hypertension, unspecified: Secondary | ICD-10-CM | POA: Diagnosis not present

## 2024-07-24 DIAGNOSIS — I10 Essential (primary) hypertension: Secondary | ICD-10-CM | POA: Insufficient documentation

## 2024-07-24 DIAGNOSIS — M21372 Foot drop, left foot: Secondary | ICD-10-CM | POA: Diagnosis present

## 2024-07-24 DIAGNOSIS — Z882 Allergy status to sulfonamides status: Secondary | ICD-10-CM | POA: Diagnosis not present

## 2024-07-24 DIAGNOSIS — Z743 Need for continuous supervision: Secondary | ICD-10-CM | POA: Diagnosis not present

## 2024-07-24 DIAGNOSIS — Z90710 Acquired absence of both cervix and uterus: Secondary | ICD-10-CM | POA: Diagnosis not present

## 2024-07-24 DIAGNOSIS — N390 Urinary tract infection, site not specified: Secondary | ICD-10-CM | POA: Diagnosis present

## 2024-07-24 DIAGNOSIS — Z7401 Bed confinement status: Secondary | ICD-10-CM | POA: Diagnosis not present

## 2024-07-24 DIAGNOSIS — G35 Multiple sclerosis: Secondary | ICD-10-CM | POA: Insufficient documentation

## 2024-07-24 DIAGNOSIS — Z7982 Long term (current) use of aspirin: Secondary | ICD-10-CM | POA: Diagnosis not present

## 2024-07-24 DIAGNOSIS — R2689 Other abnormalities of gait and mobility: Secondary | ICD-10-CM | POA: Diagnosis not present

## 2024-07-24 DIAGNOSIS — M6281 Muscle weakness (generalized): Secondary | ICD-10-CM | POA: Diagnosis not present

## 2024-07-24 DIAGNOSIS — D849 Immunodeficiency, unspecified: Secondary | ICD-10-CM | POA: Diagnosis present

## 2024-07-24 DIAGNOSIS — R29898 Other symptoms and signs involving the musculoskeletal system: Secondary | ICD-10-CM | POA: Diagnosis not present

## 2024-07-24 DIAGNOSIS — Z8249 Family history of ischemic heart disease and other diseases of the circulatory system: Secondary | ICD-10-CM | POA: Diagnosis not present

## 2024-07-24 DIAGNOSIS — Z79899 Other long term (current) drug therapy: Secondary | ICD-10-CM | POA: Diagnosis not present

## 2024-07-24 DIAGNOSIS — Z6841 Body Mass Index (BMI) 40.0 and over, adult: Secondary | ICD-10-CM | POA: Diagnosis not present

## 2024-07-24 LAB — BASIC METABOLIC PANEL WITH GFR
Anion gap: 11 (ref 5–15)
BUN: 22 mg/dL (ref 8–23)
CO2: 24 mmol/L (ref 22–32)
Calcium: 9.6 mg/dL (ref 8.9–10.3)
Chloride: 108 mmol/L (ref 98–111)
Creatinine, Ser: 0.7 mg/dL (ref 0.44–1.00)
GFR, Estimated: >60 mL/min (ref >60)
Glucose, Bld: 99 mg/dL (ref 70–99)
Potassium: 3.7 mmol/L (ref 3.5–5.1)
Sodium: 143 mmol/L (ref 135–145)

## 2024-07-24 LAB — CBC
HCT: 41.6 % (ref 36.0–46.0)
Hemoglobin: 12.8 g/dL (ref 12.0–15.0)
MCH: 27.8 pg (ref 26.0–34.0)
MCHC: 30.8 g/dL (ref 30.0–36.0)
MCV: 90.4 fL (ref 80.0–100.0)
Platelets: 281 K/uL (ref 150–400)
RBC: 4.6 MIL/uL (ref 3.87–5.11)
RDW: 14.1 % (ref 11.5–15.5)
WBC: 7 K/uL (ref 4.0–10.5)
nRBC: 0 % (ref 0.0–0.2)

## 2024-07-24 LAB — HIV ANTIBODY (ROUTINE TESTING W REFLEX): HIV Screen 4th Generation wRfx: NONREACTIVE

## 2024-07-24 MED ORDER — FLUCONAZOLE 100 MG PO TABS
200.0000 mg | ORAL_TABLET | Freq: Every day | ORAL | Status: DC
  Administered 2024-07-24 – 2024-07-26 (×3): 200 mg via ORAL
  Filled 2024-07-24 (×3): qty 2

## 2024-07-24 MED ORDER — SODIUM CHLORIDE 0.9 % IV SOLN: INTRAVENOUS | Status: AC | PRN

## 2024-07-24 NOTE — Assessment & Plan Note (Signed)
-   Symptoms include lower abdominal pressure/fullness and increased urinary frequency -UA consistent with infection: Large LE, negative nitrite, greater than 50 WBC, many bacteria, budding yeast -Initially started on Rocephin  on admission.  Adding fluconazole  given presence of yeast and persistence of symptoms at home despite Keflex  course -Have asked lab to also send out for yeast speciation and sensitivity - Follow-up pending urine culture; so far growing 80K GNR - Subjectively feeling better this morning, so we will continue current treatment course

## 2024-07-24 NOTE — Care Management Obs Status (Signed)
 MEDICARE OBSERVATION STATUS NOTIFICATION   Patient Details  Name: Lauren Curry MRN: [id number] Date of Birth: 1960-12-31   Medicare Observation Status Notification Given:  Yes    Sakib Noguez LILLETTE Fenton, LCSW 07/24/2024, 9:19 AM

## 2024-07-24 NOTE — Assessment & Plan Note (Signed)
 Continue HCTZ

## 2024-07-24 NOTE — Progress Notes (Signed)
 Progress Note    Lauren Curry   FMW:[id number]  DOB: 1961-06-21  DOA: 07/23/2024     0 PCP: Pcp, No  Initial CC: lower abdominal fullness  Hospital Course: Lauren Curry is a 63 y.o. female with PMH MS on Kesimpta with LLE weakness/bedbound status, hx UTIs, HTN who presented with concern for ongoing UTI. She had a telehealth visit at home approximately a couple weeks ago and was prescribed Bactrim  initially for a UTI.  She had a rash and was then changed to Keflex .  She also states her neurologist had also ordered a urinalysis from her home health nurse during this timeframe.  Results are unavailable in epic. Despite Keflex  course, she states lower abdominal pressure lingered and worsened after completing her course.  Symptoms are similar to UTIs she has had in the past.  She therefore presented for further workup. UA on admission notable for large LE, negative nitrite, greater than 50 WBC, many bacteria, budding yeast.  She was started on Rocephin .  Interval History:  Husband present bedside this morning.  Main symptom she has is lower abdominal fullness especially after completion of Keflex .  Ongoing increased urinary frequency too.  Denied any fevers prior to admission.  Assessment and Plan: * Acute cystitis - Symptoms include lower abdominal pressure/fullness and increased urinary frequency -UA consistent with infection: Large LE, negative nitrite, greater than 50 WBC, many bacteria, budding yeast -Initially started on Rocephin  on admission.  Adding fluconazole  given presence of yeast and persistence of symptoms at home despite Keflex  course -Have asked lab to also send out for yeast speciation and sensitivity - Follow-up pending urine culture  Hypertension - Continue HCTZ  Multiple sclerosis (HCC) - Follows with neurology in [place] -Recent MRI brain and C-spine ordered.  Follow-up results with neurology - She is on Kesimpta - Main neurologic deficit appears to be left  lower extremity weakness.  She has been bedbound for approximately 1 year -She is also requesting PT evaluation to see if candidate for rehab    Old records reviewed in assessment of this patient  Antimicrobials: Rocephin  07/23/2024 >> current Fluconazole  07/24/2024 >> current  DVT prophylaxis:   Lovenox   Code Status:   Code Status: Full Code  Mobility Assessment (Last 72 Hours)     Mobility Assessment     Row Name 07/24/24 0800 07/23/24 1945 07/23/24 1500       Does the patient have exclusion criteria? Yes- Hold (Level 0) - Assessment complete Yes - Bedfast (Level 1) - Select exclusion criteria in next row Yes - Bedfast (Level 1) - Select exclusion criteria in next row     Mobility Assessment Exclusion Criteria No exclusion criteria present, perform mobility assessment No exclusion criteria present, perform mobility assessment No exclusion criteria present, perform mobility assessment     What is the highest level of mobility based on the mobility assessment? Level 1 (Bedfast) - Unable to balance while sitting on edge of bed -- Level 1 (Bedfast) - Unable to balance while sitting on edge of bed     Is the above level different from baseline mobility prior to current illness? Yes - Recommend PT order -- Yes - Recommend PT order        Barriers to discharge: None Disposition Plan: TBD HH orders placed: TBD Status is: Observation  Objective: Blood pressure 125/76, pulse 65, temperature 98.1 F (36.7 C), temperature source Oral, resp. rate 16, height 5' 5 (1.651 m), weight 127 kg, SpO2 100%.  Examination:  Physical Exam  Constitutional:      Appearance: Normal appearance. She is obese.  HENT:     Head: Normocephalic and atraumatic.     Mouth/Throat:     Mouth: Mucous membranes are moist.  Eyes:     Extraocular Movements: Extraocular movements intact.  Cardiovascular:     Rate and Rhythm: Normal rate and regular rhythm.  Pulmonary:     Effort: Pulmonary effort is normal. No  respiratory distress.     Breath sounds: Normal breath sounds. No wheezing.  Abdominal:     General: Bowel sounds are normal. There is no distension.     Palpations: Abdomen is soft.     Tenderness: There is no abdominal tenderness.  Musculoskeletal:        General: Normal range of motion.     Cervical back: Normal range of motion and neck supple.  Skin:    General: Skin is warm and dry.  Neurological:     Mental Status: She is alert.     Comments: 2/5 strength LLE; no sensory deficits  Psychiatric:        Mood and Affect: Mood normal.      Consultants:    Procedures:    Data Reviewed: Results for orders placed or performed during the hospital encounter of 07/23/24 (from the past 24 hours)  Basic metabolic panel     Status: None   Collection Time: 07/24/24  5:13 AM  Result Value Ref Range   Sodium 143 135 - 145 mmol/L   Potassium 3.7 3.5 - 5.1 mmol/L   Chloride 108 98 - 111 mmol/L   CO2 24 22 - 32 mmol/L   Glucose, Bld 99 70 - 99 mg/dL   BUN 22 8 - 23 mg/dL   Creatinine, Ser 9.29 0.44 - 1.00 mg/dL   Calcium 9.6 8.9 - 89.6 mg/dL   GFR, Estimated >39 >39 mL/min   Anion gap 11 5 - 15  CBC     Status: None   Collection Time: 07/24/24  5:13 AM  Result Value Ref Range   WBC 7.0 4.0 - 10.5 K/uL   RBC 4.60 3.87 - 5.11 MIL/uL   Hemoglobin 12.8 12.0 - 15.0 g/dL   HCT 58.3 63.9 - 53.9 %   MCV 90.4 80.0 - 100.0 fL   MCH 27.8 26.0 - 34.0 pg   MCHC 30.8 30.0 - 36.0 g/dL   RDW 85.8 88.4 - 84.4 %   Platelets 281 150 - 400 K/uL   nRBC 0.0 0.0 - 0.2 %    I have reviewed pertinent nursing notes, vitals, labs, and images as necessary. I have ordered labwork to follow up on as indicated.  I have reviewed the last notes from staff over past 24 hours. I have discussed patient's care plan and test results with nursing staff, CM/SW, and other staff as appropriate.  Time spent: Greater than 50% of the 55 minute visit was spent in counseling/coordination of care for the patient as  laid out in the A&P.   LOS: 0 days   Alm Apo, MD [facility] 07/24/2024, 12:08 PM

## 2024-07-24 NOTE — Hospital Course (Signed)
 Lauren Curry is a 63 y.o. female with PMH MS on Kesimpta with LLE weakness/bedbound status, hx UTIs, HTN who presented with concern for ongoing UTI. She had a telehealth visit at home approximately a couple weeks ago and was prescribed Bactrim  initially for a UTI.  She had a rash and was then changed to Keflex .  She also states her neurologist had also ordered a urinalysis from her home health nurse during this timeframe.  Results are unavailable in epic. Despite Keflex  course, she states lower abdominal pressure lingered and worsened after completing her course.  Symptoms are similar to UTIs she has had in the past.  She therefore presented for further workup. UA on admission notable for large LE, negative nitrite, greater than 50 WBC, many bacteria, budding yeast.  She was started on Rocephin .

## 2024-07-24 NOTE — Plan of Care (Signed)
   Problem: Clinical Measurements: Goal: Cardiovascular complication will be avoided Outcome: Progressing

## 2024-07-24 NOTE — Assessment & Plan Note (Signed)
-   Follows with neurology in [place] -Recent MRI brain and C-spine ordered.  Follow-up results with neurology - She is on Kesimpta - Main neurologic deficit appears to be left lower extremity weakness.  She has been bedbound for approximately 1 year -She is also requesting PT evaluation to see if candidate for rehab; OT eval added this morning as well

## 2024-07-24 NOTE — Plan of Care (Signed)
 Pt calm and cooperative. Repositioned throughout shift. Pillow placed on left side to relieve pressure causing a burning pain. Pt given tylenol  for HA. Does not c/o any burning or itching but does have a full feeling at the end of voids. She is voiding using poise pads. Standby assist for BM. States that she will work with PT to move around independently. Will continue to monitor.

## 2024-07-25 DIAGNOSIS — G35 Multiple sclerosis: Secondary | ICD-10-CM

## 2024-07-25 DIAGNOSIS — N3000 Acute cystitis without hematuria: Secondary | ICD-10-CM | POA: Diagnosis not present

## 2024-07-25 MED ORDER — PANTOPRAZOLE SODIUM 40 MG IV SOLR
40.0000 mg | Freq: Once | INTRAVENOUS | Status: AC
  Administered 2024-07-25: 40 mg via INTRAVENOUS
  Filled 2024-07-25: qty 10

## 2024-07-25 NOTE — Progress Notes (Signed)
 Progress Note    Lauren Curry   FMW:[id number]  DOB: [birth year]  DOA: 07/23/2024     1 PCP: Pcp, No  Initial CC: lower abdominal fullness  Hospital Course: Lauren Curry is a 63 y.o. female with PMH MS on Kesimpta with LLE weakness/bedbound status, hx UTIs, HTN who presented with concern for ongoing UTI. She had a telehealth visit at home approximately a couple weeks ago and was prescribed Bactrim  initially for a UTI.  She had a rash and was then changed to Keflex .  She also states her neurologist had also ordered a urinalysis from her home health nurse during this timeframe.  Results are unavailable in epic. Despite Keflex  course, she states lower abdominal pressure lingered and worsened after completing her course.  Symptoms are similar to UTIs she has had in the past.  She therefore presented for further workup. UA on admission notable for large LE, negative nitrite, greater than 50 WBC, many bacteria, budding yeast.  She was started on Rocephin .  Interval History:  Some concern for mild right arm swelling this morning.  Does not appear to have any significant edema nor any erythema or calor.  Informed her we will continue to watch.  Does have an IV in that arm. Seems to be improving since yesterday in terms of her UTI symptoms.  Still awaiting culture results but continuing current course. She was also evaluated by PT this morning.  Recommendation at this time is for SNF.  Assessment and Plan: * Acute cystitis - Symptoms include lower abdominal pressure/fullness and increased urinary frequency -UA consistent with infection: Large LE, negative nitrite, greater than 50 WBC, many bacteria, budding yeast -Initially started on Rocephin  on admission.  Adding fluconazole  given presence of yeast and persistence of symptoms at home despite Keflex  course -Have asked lab to also send out for yeast speciation and sensitivity - Follow-up pending urine culture; so far growing 80K GNR -  Subjectively feeling better this morning, so we will continue current treatment course  Hypertension - Continue HCTZ  Multiple sclerosis (HCC) - Follows with neurology in [place] -Recent MRI brain and C-spine ordered.  Follow-up results with neurology - She is on Kesimpta - Main neurologic deficit appears to be left lower extremity weakness.  She has been bedbound for approximately 1 year -She is also requesting PT evaluation to see if candidate for rehab; OT eval added this morning as well    Old records reviewed in assessment of this patient  Antimicrobials: Rocephin  07/23/2024 >> current Fluconazole  07/24/2024 >> current  DVT prophylaxis:   Lovenox   Code Status:   Code Status: Full Code  Mobility Assessment (Last 72 Hours)     Mobility Assessment     Row Name 07/25/24 1208 07/25/24 0913 07/24/24 2040 07/24/24 0800 07/23/24 1945   Does the patient have exclusion criteria? -- Yes - Bedfast (Level 1) - Select exclusion criteria in next row Yes- Hold (Level 0) - Assessment complete Yes- Hold (Level 0) - Assessment complete Yes - Bedfast (Level 1) - Select exclusion criteria in next row   Mobility Assessment Exclusion Criteria -- No exclusion criteria present, perform mobility assessment No exclusion criteria present, perform mobility assessment No exclusion criteria present, perform mobility assessment No exclusion criteria present, perform mobility assessment   What is the highest level of mobility based on the mobility assessment? Level 2 (Chairfast) - Balance while sitting on edge of bed and cannot stand Level 1 (Bedfast) - Unable to balance while sitting on edge of bed  Level 1 (Bedfast) - Unable to balance while sitting on edge of bed Level 1 (Bedfast) - Unable to balance while sitting on edge of bed --   Is the above level different from baseline mobility prior to current illness? -- Yes - Recommend PT order Yes - Recommend PT order Yes - Recommend PT order --    Row Name 07/23/24  1500           Does the patient have exclusion criteria? Yes - Bedfast (Level 1) - Select exclusion criteria in next row       Mobility Assessment Exclusion Criteria No exclusion criteria present, perform mobility assessment       What is the highest level of mobility based on the mobility assessment? Level 1 (Bedfast) - Unable to balance while sitting on edge of bed       Is the above level different from baseline mobility prior to current illness? Yes - Recommend PT order          Barriers to discharge: None Disposition Plan: TBD HH orders placed: TBD Status is: Observation  Objective: Blood pressure 133/80, pulse (!) 58, temperature 97.9 F (36.6 C), temperature source Oral, resp. rate 15, height 5' 5 (1.651 m), weight 127 kg, SpO2 100%.  Examination:  Physical Exam Constitutional:      Appearance: Normal appearance. She is obese.  HENT:     Head: Normocephalic and atraumatic.     Mouth/Throat:     Mouth: Mucous membranes are moist.  Eyes:     Extraocular Movements: Extraocular movements intact.  Cardiovascular:     Rate and Rhythm: Normal rate and regular rhythm.  Pulmonary:     Effort: Pulmonary effort is normal. No respiratory distress.     Breath sounds: Normal breath sounds. No wheezing.  Abdominal:     General: Bowel sounds are normal. There is no distension.     Palpations: Abdomen is soft.     Tenderness: There is no abdominal tenderness.  Musculoskeletal:        General: Normal range of motion.     Cervical back: Normal range of motion and neck supple.  Skin:    General: Skin is warm and dry.  Neurological:     Mental Status: She is alert.     Comments: 2/5 strength LLE; no sensory deficits  Psychiatric:        Mood and Affect: Mood normal.      Consultants:    Procedures:    Data Reviewed: No results found for this or any previous visit (from the past 24 hours).   I have reviewed pertinent nursing notes, vitals, labs, and images as  necessary. I have ordered labwork to follow up on as indicated.  I have reviewed the last notes from staff over past 24 hours. I have discussed patient's care plan and test results with nursing staff, CM/SW, and other staff as appropriate.  Time spent: Greater than 50% of the 55 minute visit was spent in counseling/coordination of care for the patient as laid out in the A&P.   LOS: 1 day   Alm Apo, MD [facility] 07/25/2024, 12:59 PM

## 2024-07-25 NOTE — TOC Initial Note (Addendum)
 Transition of Care [facility]) - Initial/Assessment Note    Patient Details  Name: Lauren Curry MRN: [id number] Date of Birth: [birth year]  [facility] of [facility]) CM/SW Contact:    Alfonse JONELLE Rex, RN Phone Number: 07/25/2024, 2:13 PM  Clinical Narrative:  Met with patient at bedside to introduce role of TOC/NCM and review for dc planning, PT eval completed, recommendation for short term rehab/SNF, pt  agreeable, no preference except would like a facility in [place], patient reports she has an established PCP, has a HHA, resides with her sister and brother in in law. Patient reports at baseline she is unable to ambulate secondary to LLE weakness, she states she uses adult diapers at home, can move herself to her [facility] for BM's.  FL2 updated, faxed out for bed offers.                   Expected Discharge Plan: Skilled Nursing Facility Barriers to Discharge: Continued Medical Work up   Patient Goals and CMS Choice Patient states their goals for this hospitalization and ongoing recovery are:: regain mobility CMS Medicare.gov Compare Post Acute Care list provided to:: Patient Choice offered to / list presented to : Patient  ownership interest in [employer].provided to:: Patient    Expected Discharge Plan and Services       Living arrangements for the past 2 months: Single Family Home                                      Prior Living Arrangements/Services Living arrangements for the past 2 months: Single Family Home Lives with:: Siblings Patient language and need for interpreter reviewed:: Yes Do you feel safe going back to the place where you live?: Yes      Need for Family Participation in Patient Care: Yes (Comment) Care giver support system in place?: Yes (comment) Current home services: DME (BSC) Criminal Activity/Legal Involvement Pertinent to Current Situation/Hospitalization: No - Comment as needed  Activities of Daily Living   ADL Screening  (condition at time of admission) Independently performs ADLs?: No Does the patient have a NEW difficulty with bathing/dressing/toileting/self-feeding that is expected to last >3 days?: No Does the patient have a NEW difficulty with getting in/out of bed, walking, or climbing stairs that is expected to last >3 days?: No Does the patient have a NEW difficulty with communication that is expected to last >3 days?: No Is the patient deaf or have difficulty hearing?: No Does the patient have difficulty seeing, even when wearing glasses/contacts?: No Does the patient have difficulty concentrating, remembering, or making decisions?: No  Permission Sought/Granted                  Emotional Assessment Appearance:: Appears stated age Attitude/Demeanor/Rapport: Engaged Affect (typically observed): Accepting Orientation: : Oriented to Self, Oriented to Place, Oriented to  Time, Oriented to Situation Alcohol / Substance Use: Not Applicable Psych Involvement: No (comment)  Admission diagnosis:  Lower urinary tract infectious disease [N39.0] UTI (urinary tract infection) [N39.0] Acute cystitis [N30.00] Patient Active Problem List   Diagnosis Date Noted   Multiple sclerosis (HCC)    Hypertension    Acute cystitis 07/23/2024   PCP:  Pcp, No Pharmacy:   [id number] - [place], [place] - [address] AT [facility] [address] [place] [address] Phone: [phone] Fax: [phone]  Social Drivers of Health (SDOH) Social History: SDOH Screenings   Food Insecurity: No Food Insecurity (07/23/2024)  Housing: Low Risk  (07/23/2024)  Transportation Needs: No Transportation Needs (07/23/2024)  Utilities: Not At Risk (07/23/2024)  Financial Resource Strain: Not on File (02/28/2020)   Received from [facility]  Physical Activity: Not on File (02/28/2020)   Received from [facility]  Social Connections: Moderately Integrated (07/23/2024)  Stress: Not on File  (02/28/2020)   Received from [facility]  Tobacco Use: Low Risk  (07/23/2024)   SDOH Interventions:     Readmission Risk Interventions    07/25/2024    2:10 PM  Readmission Risk Prevention Plan  Post Dischage Appt Complete  Medication Screening Complete  Transportation Screening Complete

## 2024-07-25 NOTE — Plan of Care (Signed)
  Problem: Clinical Measurements: Goal: Respiratory complications will improve Outcome: Progressing   Problem: Nutrition: Goal: Adequate nutrition will be maintained Outcome: Completed/Met

## 2024-07-25 NOTE — [place] FL2 (Signed)
 [place]  MEDICAID FL2 LEVEL OF CARE FORM     IDENTIFICATION  Patient Name: Lauren Curry Birthdate: 1961-04-04 Sex: female Admission Date (Current Location): 07/23/2024  [facility] and [place] Number:  Producer, television/film/video and Address:  [facility],  [address], [place] [address]      Provider Number: [id number]  Attending Physician Name and Address:  Patsy Lenis, MD  Relative Name and Phone Number:  Morris,iris (Sister)  [phone] [facility])    Current Level of Care: Hospital Recommended Level of Care: Skilled Nursing Facility Prior Approval Number:    Date Approved/Denied:   PASRR Number: [id number]  Discharge Plan: SNF    Current Diagnoses: Patient Active Problem List   Diagnosis Date Noted   Multiple sclerosis (HCC)    Hypertension    Acute cystitis 07/23/2024    Orientation RESPIRATION BLADDER Height & Weight     Self, Time, Situation, Place  Normal Continent Weight: 127 kg Height:  5' 5 (165.1 cm)  BEHAVIORAL SYMPTOMS/MOOD NEUROLOGICAL BOWEL NUTRITION STATUS      Continent Diet (regular diet)  AMBULATORY STATUS COMMUNICATION OF NEEDS Skin   Extensive Assist Verbally                         Personal Care Assistance Level of Assistance  Bathing, Feeding, Dressing Bathing Assistance: Maximum assistance Feeding assistance: Limited assistance Dressing Assistance: Maximum assistance     Functional Limitations Info  Sight, Hearing, Speech Sight Info: Adequate Hearing Info: Adequate Speech Info: Adequate    SPECIAL CARE FACTORS FREQUENCY  PT (By licensed PT), OT (By licensed OT)     PT Frequency: 5x/wk OT Frequency: 5x/wk            Contractures Contractures Info: Not present    Additional Factors Info  Code Status, Allergies, Psychotropic Code Status Info: Full Code Allergies Info: Bactrim  (Sulfamethoxazole -trimethoprim ) Psychotropic Info: N/A         Current Medications (07/25/2024):  This is the current  hospital active medication list Current Facility-Administered Medications  Medication Dose Route Frequency Provider Last Rate Last Admin   acetaminophen  (TYLENOL ) tablet 650 mg  650 mg Oral Q6H PRN Zella, Mir M, MD   650 mg at 07/25/24 1009   Or   acetaminophen  (TYLENOL ) suppository 650 mg  650 mg Rectal Q6H PRN Zella, Mir M, MD       albuterol  (PROVENTIL ) (2.5 MG/3ML) 0.083% nebulizer solution 2.5 mg  2.5 mg Nebulization Q2H PRN Zella, Mir M, MD       cefTRIAXone  (ROCEPHIN ) 1 g in sodium chloride  0.9 % 100 mL IVPB  1 g Intravenous Q24H Zella, Mir M, MD   Stopped at 07/25/24 1246   enoxaparin  (LOVENOX ) injection 60 mg  60 mg Subcutaneous Q24H Zella, Mir M, MD   60 mg at 07/25/24 1416   fluconazole  (DIFLUCAN ) tablet 200 mg  200 mg Oral Daily Patsy Lenis, MD   200 mg at 07/25/24 0913   hydrochlorothiazide  (HYDRODIURIL ) tablet 12.5 mg  12.5 mg Oral Daily Zella, Mir M, MD   12.5 mg at 07/25/24 0913   ondansetron  (ZOFRAN ) tablet 4 mg  4 mg Oral Q6H PRN Zella, Mir M, MD       Or   ondansetron  (ZOFRAN ) injection 4 mg  4 mg Intravenous Q6H PRN Zella, Mir M, MD       potassium chloride  (KLOR-CON ) CR tablet 20 mEq  20 mEq Oral q morning Zella, Mir M, MD   20  mEq at 07/25/24 0913   traZODone  (DESYREL ) tablet 25 mg  25 mg Oral QHS PRN Zella Katha HERO, MD         Discharge Medications: Please see discharge summary for a list of discharge medications.  Relevant Imaging Results:  Relevant Lab Results:   Additional Information [id number]  Alfonse JONELLE Rex, RN

## 2024-07-25 NOTE — Evaluation (Signed)
 Physical Therapy Evaluation Patient Details Name: Lauren Curry MRN: [id number] DOB: 06-24-1961 Today's Date: 07/25/2024  History of Present Illness  Patient is a 63 y/o female admitted 07/23/24 with back pain, weakness and UTI which failed outpatient treatment.  PMH positive for HTN and multiple sclerosis with significant L >R LE weakness.  Clinical Impression  Patient presents with decreased mobility due to pain, weakness, decreased activity tolerance and high risk for falls.  She was mobilizing bed to [facility] after stay on rehab last year until the passing of both of her parents.  States has been somewhat bed bound though trying to get up more with HH therapies though very scared to fall.  She was able to mobilize up to sit on EOB using rails and HOB elevation with min A.  She stood with +2 A from elevated surface though cannot maintain with LE weakness.  Feel she will benefit from skilled PT in the acute setting and from post-acute inpatient rehab (<3 hours/day) prior to d/c home.         If plan is discharge home, recommend the following: A lot of help with bathing/dressing/bathroom;Two people to help with walking and/or transfers   Can travel by private vehicle   No    Equipment Recommendations Deitra lift  Recommendations for Other Services       Functional Status Assessment Patient has had a recent decline in their functional status and demonstrates the ability to make significant improvements in function in a reasonable and predictable amount of time.     Precautions / Restrictions Precautions Precautions: Fall Recall of Precautions/Restrictions: Intact      Mobility  Bed Mobility Overal bed mobility: Needs Assistance Bed Mobility: Supine to Sit, Sit to Supine     Supine to sit: Min assist, HOB elevated, Used rails Sit to supine: Mod assist, Used rails, HOB elevated   General bed mobility comments: heavy rail use and momentum with increased time to sit up, A for L LE off  EOB; assist for to sidelying due to back pain lifting legs and pt using rail; scooting up in bed using rails, headboard, increased time, bed in trendelenberg    Transfers Overall transfer level: Needs assistance   Transfers: Sit to/from Stand, Bed to chair/wheelchair/BSC Sit to Stand: Mod assist, +2 physical assistance, From elevated surface          Lateral/Scoot Transfers: Max assist General transfer comment: stood after couple of attempts with NT in the room to assist and elevated height of bed, pt unable to maintain, scooting toward drop arm recliner with overall mod A for safety though stopped due to pain and concern for return to bed and NT wanting to help with bath    Ambulation/Gait               General Gait Details: unable  Stairs            Wheelchair Mobility     Tilt Bed    Modified Rankin (Stroke Patients Only)       Balance Overall balance assessment: Needs assistance Sitting-balance support: Feet supported Sitting balance-Leahy Scale: Fair Sitting balance - Comments: static balance without UE support, needs A for safety with dynamic mobility     Standing balance-Leahy Scale: Zero                               Pertinent Vitals/Pain Pain Assessment Pain Assessment: 0-10 Pain Score: 4  Pain Location: back (increased with movement) Pain Descriptors / Indicators: Grimacing, Guarding, Sore, Aching Pain Intervention(s): RN gave pain meds during session, Patient requesting pain meds-RN notified, Monitored during session, Limited activity within patient's tolerance    Home Living Family/patient expects to be discharged to:: Private residence Living Arrangements: Other relatives (sister and brother) Available Help at Discharge: Family Type of Home: House Home Access: Ramped entrance       Home Layout: One level Home Equipment: Agricultural consultant (2 wheels);Wheelchair - manual;Wheelchair - power;BSC/3in1 (adjustable bed) Additional  Comments: [facility] for PT and OT just started, has aide 15 hours 11-1:30 M-F; states after rehab last year had been using drop arm BSC and got one but equipment company took it back since insurance would not pay for a new one    Prior Function Prior Level of Function : Needs assist             Mobility Comments: since her father passed last December and mother this May she has declined a lot per her report, just started PT at home though not getting up much at all ADLs Comments: aide helps with bed bath     Extremity/Trunk Assessment   Upper Extremity Assessment Upper Extremity Assessment: RUE deficits/detail RUE Deficits / Details: AROM limited shoulder flexion to about 100 and painful, has some chronic pain though reports more lately, noted increased girth compared to L UE    Lower Extremity Assessment Lower Extremity Assessment: RLE deficits/detail;LLE deficits/detail RLE Deficits / Details: AAROM WFL, pt has to reach down to help knee flex fully in supine, strength ankle DF 4-/5, knee extension 3+/5, hip flexion <3/5 LLE Deficits / Details: AAROM WFL with some spasms noted, strength hip flexion NT, knee extension 2/5, ankle DF 1/5, PF 3+/5 LLE Sensation: decreased light touch LLE Coordination: decreased gross motor       Communication        Cognition Arousal: Alert Behavior During Therapy: WFL for tasks assessed/performed   PT - Cognitive impairments: No apparent impairments                         Following commands: Intact       Cueing       General Comments General comments (skin integrity, edema, etc.): using pads from home due to urinary incontinence, felt she was incontinent when moving to EOB, states feels that is contributing to UTI's    Exercises     Assessment/Plan    PT Assessment Patient needs continued PT services  PT Problem List Decreased strength;Decreased activity tolerance;Decreased balance;Decreased mobility;Decreased knowledge  of use of DME;Decreased range of motion;Pain       PT Treatment Interventions DME instruction;Functional mobility training;Therapeutic activities;Therapeutic exercise;Balance training;Wheelchair mobility training;Patient/family education;Neuromuscular re-education    PT Goals (Current goals can be found in the Care Plan section)  Acute Rehab PT Goals Patient Stated Goal: go to rehab and get stronger PT Goal Formulation: With patient Time For Goal Achievement: 08/08/24 Potential to Achieve Goals: Good    Frequency Min 2X/week     Co-evaluation               AM-PAC PT 6 Clicks Mobility  Outcome Measure Help needed turning from your back to your side while in a flat bed without using bedrails?: A Lot Help needed moving from lying on your back to sitting on the side of a flat bed without using bedrails?: A Lot Help needed moving to and from  a bed to a chair (including a wheelchair)?: Total Help needed standing up from a chair using your arms (e.g., wheelchair or bedside chair)?: Total Help needed to walk in hospital room?: Total Help needed climbing 3-5 steps with a railing? : Total 6 Click Score: 8    End of Session Equipment Utilized During Treatment: Gait belt Activity Tolerance: Patient limited by pain Patient left: in bed;with call bell/phone within reach;with nursing/sitter in room   PT Visit Diagnosis: Other abnormalities of gait and mobility (R26.89);Muscle weakness (generalized) (M62.81);History of falling (Z91.81);Pain Pain - part of body:  (back)    Time: 9041-8958 PT Time Calculation (min) (ACUTE ONLY): 43 min   Charges:   PT Evaluation $PT Eval Moderate Complexity: 1 Mod PT Treatments $Therapeutic Activity: 8-22 mins PT General Charges $$ ACUTE PT VISIT: 1 Visit         Micheline Portal, PT Acute Rehabilitation Services Office:[phone] 07/25/2024   Montie Portal 07/25/2024, 12:11 PM

## 2024-07-26 DIAGNOSIS — N3000 Acute cystitis without hematuria: Secondary | ICD-10-CM | POA: Diagnosis not present

## 2024-07-26 LAB — URINE CULTURE: Culture: 80000 — AB

## 2024-07-26 MED ORDER — BISACODYL 5 MG PO TBEC
5.0000 mg | DELAYED_RELEASE_TABLET | Freq: Every day | ORAL | Status: DC
  Administered 2024-07-26 – 2024-07-27 (×2): 5 mg via ORAL
  Filled 2024-07-26 (×2): qty 1

## 2024-07-26 MED ORDER — POLYETHYLENE GLYCOL 3350 17 G PO PACK: 17.0000 g | PACK | Freq: Every day | ORAL | Status: DC | PRN

## 2024-07-26 MED ORDER — SENNOSIDES-DOCUSATE SODIUM 8.6-50 MG PO TABS
1.0000 | ORAL_TABLET | Freq: Two times a day (BID) | ORAL | Status: DC
  Administered 2024-07-26 – 2024-07-27 (×2): 1 via ORAL
  Filled 2024-07-26 (×3): qty 1

## 2024-07-26 MED ORDER — CEFADROXIL 500 MG PO CAPS
1000.0000 mg | ORAL_CAPSULE | Freq: Two times a day (BID) | ORAL | Status: DC
  Administered 2024-07-27: 1000 mg via ORAL
  Filled 2024-07-26 (×2): qty 2

## 2024-07-26 NOTE — Progress Notes (Signed)
 Progress Note    Lauren Curry   FMW:[id number]  DOB: 02-Feb-1961  DOA: 07/23/2024     2 PCP: Pcp, No  Initial CC: lower abdominal fullness  Hospital Course: Lauren Curry is a 63 y.o. female with PMH MS on Kesimpta with LLE weakness/bedbound status, hx UTIs, HTN who presented with concern for ongoing UTI. She had a telehealth visit at home approximately a couple weeks ago and was prescribed Bactrim  initially for a UTI.  She had a rash and was then changed to Keflex .  She also states her neurologist had also ordered a urinalysis from her home health nurse during this timeframe.  Results are unavailable in epic. Despite Keflex  course, she states lower abdominal pressure lingered and worsened after completing her course.  Symptoms are similar to UTIs she has had in the past.  She therefore presented for further workup. UA on admission notable for large LE, negative nitrite, greater than 50 WBC, many bacteria, budding yeast.  She was started on Rocephin .  Interval History:  Overall feeling better.  Slight ongoing abdominal discomfort but overall improved compared to admission. Culture data has returned and discussed with micro.  The yeast culture did not grow, will discontinue fluconazole  as well. Patient amenable with going to rehab, she has accepted bed at [facility]. Should be stable for discharge Wednesday.  Assessment and Plan: * Acute cystitis - Symptoms include lower abdominal pressure/fullness and increased urinary frequency -UA consistent with infection: Large LE, negative nitrite, greater than 50 WBC, many bacteria, budding yeast -Initially started on Rocephin  on admission.  Adding fluconazole  given presence of yeast and persistence of symptoms at home despite Keflex  course -Have asked lab to also send out for yeast speciation and sensitivity - Follow-up pending urine culture; Proteus with sensitivities reviewed.  Okay to transition to cefadroxil  and complete 7 days total given  failure outpt treatment prior and underlying MS - Confirmed with micro that the send out for the yeast did not grow anything so we will discontinue fluconazole  as well  Hypertension - Continue HCTZ  Multiple sclerosis (HCC) - Follows with neurology in [place] -Recent MRI brain and C-spine ordered.  Follow-up results with neurology - She is on Kesimpta - Main neurologic deficit appears to be left lower extremity weakness.  She has been bedbound for approximately 1 year - Evaluated by PT/OT with recommendations for SNF    Old records reviewed in assessment of this patient  Antimicrobials: Rocephin  07/23/2024 >> current Fluconazole  07/24/2024 >> current  DVT prophylaxis:   Lovenox   Code Status:   Code Status: Full Code  Mobility Assessment (Last 72 Hours)     Mobility Assessment     Row Name 07/26/24 1054 07/26/24 1000 07/25/24 1946 07/25/24 1208 07/25/24 0913   Does the patient have exclusion criteria? Yes - Bedfast (Level 1) - Select exclusion criteria in next row -- No - Perform mobility assessment -- Yes - Bedfast (Level 1) - Select exclusion criteria in next row   Mobility Assessment Exclusion Criteria No exclusion criteria present, perform mobility assessment -- No exclusion criteria present, perform mobility assessment -- No exclusion criteria present, perform mobility assessment   What is the highest level of mobility based on the mobility assessment? Level 2 (Chairfast) - Balance while sitting on edge of bed and cannot stand Level 2 (Chairfast) - Balance while sitting on edge of bed and cannot stand Level 2 (Chairfast) - Balance while sitting on edge of bed and cannot stand Level 2 (Chairfast) - Balance while  sitting on edge of bed and cannot stand Level 1 (Bedfast) - Unable to balance while sitting on edge of bed   Is the above level different from baseline mobility prior to current illness? Yes - Recommend PT order -- Yes - Recommend PT order -- Yes - Recommend PT order     Row Name 07/24/24 2040 07/24/24 0800 07/23/24 1945 07/23/24 1500     Does the patient have exclusion criteria? Yes- Hold (Level 0) - Assessment complete Yes- Hold (Level 0) - Assessment complete Yes - Bedfast (Level 1) - Select exclusion criteria in next row Yes - Bedfast (Level 1) - Select exclusion criteria in next row    Mobility Assessment Exclusion Criteria No exclusion criteria present, perform mobility assessment No exclusion criteria present, perform mobility assessment No exclusion criteria present, perform mobility assessment No exclusion criteria present, perform mobility assessment    What is the highest level of mobility based on the mobility assessment? Level 1 (Bedfast) - Unable to balance while sitting on edge of bed Level 1 (Bedfast) - Unable to balance while sitting on edge of bed -- Level 1 (Bedfast) - Unable to balance while sitting on edge of bed    Is the above level different from baseline mobility prior to current illness? Yes - Recommend PT order Yes - Recommend PT order -- Yes - Recommend PT order       Barriers to discharge: None Disposition Plan: Lauren Curry [facility] orders placed: N/A Status is: Inpatient  Objective: Blood pressure (!) 143/87, pulse 69, temperature 98.1 F (36.7 C), temperature source Oral, resp. rate 16, height 5' 5 (1.651 m), weight 127 kg, SpO2 100%.  Examination:  Physical Exam Constitutional:      Appearance: Normal appearance. She is obese.  HENT:     Head: Normocephalic and atraumatic.     Mouth/Throat:     Mouth: Mucous membranes are moist.  Eyes:     Extraocular Movements: Extraocular movements intact.  Cardiovascular:     Rate and Rhythm: Normal rate and regular rhythm.  Pulmonary:     Effort: Pulmonary effort is normal. No respiratory distress.     Breath sounds: Normal breath sounds. No wheezing.  Abdominal:     General: Bowel sounds are normal. There is no distension.     Palpations: Abdomen is soft.     Tenderness: There is no  abdominal tenderness.  Musculoskeletal:        General: Normal range of motion.     Cervical back: Normal range of motion and neck supple.  Skin:    General: Skin is warm and dry.  Neurological:     Mental Status: She is alert.     Comments: 2/5 strength LLE; no sensory deficits  Psychiatric:        Mood and Affect: Mood normal.      Consultants:    Procedures:    Data Reviewed: No results found for this or any previous visit (from the past 24 hours).   I have reviewed pertinent nursing notes, vitals, labs, and images as necessary. I have ordered labwork to follow up on as indicated.  I have reviewed the last notes from staff over past 24 hours. I have discussed patient's care plan and test results with nursing staff, CM/SW, and other staff as appropriate.  Time spent: Greater than 50% of the 55 minute visit was spent in counseling/coordination of care for the patient as laid out in the A&P.   LOS: 2 days  Alm Apo, MD [facility] 07/26/2024, 1:41 PM

## 2024-07-26 NOTE — Evaluation (Signed)
 Occupational Therapy Evaluation Patient Details Name: Lauren Curry MRN: [id number] DOB: 09-25-1961 Today's Date: 07/26/2024   History of Present Illness   Patient is a 63 y/o female admitted 07/23/24 with back pain, weakness and UTI which failed outpatient treatment.  PMH positive for HTN and multiple sclerosis with significant L >R LE weakness.     Clinical Impressions Pt reported at [facility] they were mainly completion of pivot transfer from one surfaces to the next but had difficulties to complete leading up to hospital visit. Pt at this time was able to complete supine to sitting with CGA with HOB elevated and bed rails. Pt was able to sit at EOB and completed UE bathing and dressing post set up  and then required total with peri area care while in supine. Pt was left in chair position in bed as attempting to have a BM and feeling some abdominal discomfort. Patient will benefit from continued inpatient follow up therapy, <3 hours/day.     If plan is discharge home, recommend the following:   Two people to help with walking and/or transfers;Two people to help with bathing/dressing/bathroom;Assistance with cooking/housework;Assist for transportation;Help with stairs or ramp for entrance     Functional Status Assessment   Patient has had a recent decline in their functional status and demonstrates the ability to make significant improvements in function in a reasonable and predictable amount of time.     Equipment Recommendations    (drop down commode)     Recommendations for Other Services         Precautions/Restrictions   Precautions Precautions: Fall Recall of Precautions/Restrictions: Intact Restrictions Weight Bearing Restrictions Per Provider Order: No     Mobility Bed Mobility Overal bed mobility: Needs Assistance Bed Mobility: Supine to Sit, Sit to Supine     Supine to sit: Contact guard, HOB elevated, Used rails Sit to supine: Mod assist, HOB elevated,  Used rails   General bed mobility comments: mod assist for BLE with sitting to supine, pt used BUE to assist with getting to the [facility]    Transfers                          Balance Overall balance assessment: Needs assistance Sitting-balance support: Feet supported Sitting balance-Leahy Scale: Good Sitting balance - Comments: able to compelte all UE ADLS at the EOB                                   ADL either performed or assessed with clinical judgement   ADL Overall ADL's : Needs assistance/impaired Eating/Feeding: Set up;Sitting   Grooming: Set up;Wash/dry hands;Wash/dry face;Oral care;Sitting   Upper Body Bathing: Set up;Sitting   Lower Body Bathing: Moderate assistance;Bed level;Sitting/lateral leans (and rolling side to side in bed)   Upper Body Dressing : Set up;Sitting   Lower Body Dressing: Moderate assistance;Sitting/lateral leans;Bed level       Toileting- Clothing Manipulation and Hygiene: Total assistance               Vision         Perception         Praxis         Pertinent Vitals/Pain Pain Assessment Pain Assessment: Faces Faces Pain Scale: Hurts little more Pain Location: R shoulder, abdomen Pain Descriptors / Indicators: Grimacing, Guarding, Sore, Aching     Extremity/Trunk Assessment Upper Extremity Assessment Upper Extremity  Assessment: RUE deficits/detail;LUE deficits/detail RUE Deficits / Details: AROM limited shoulder flexion to about 100 and painful, has some chronic pain though reports more lately, increase in edema in RUE RUE Sensation: WNL RUE Coordination: decreased gross motor LUE Deficits / Details: WFL but noted to about 100 deg shoulder flexion LUE Sensation: WNL LUE Coordination: decreased gross motor   Lower Extremity Assessment Lower Extremity Assessment: Defer to PT evaluation   Cervical / Trunk Assessment Cervical / Trunk Assessment: Normal   Communication  Communication Communication: No apparent difficulties   Cognition Arousal: Alert Behavior During Therapy: WFL for tasks assessed/performed Cognition: No apparent impairments                               Following commands: Intact       Cueing  General Comments   Cueing Techniques: Verbal cues      Exercises     Shoulder Instructions      Home Living Family/patient expects to be discharged to:: Private residence Living Arrangements: Other relatives (sister and brother) Available Help at Discharge: Family Type of Home: House Home Access: Ramped entrance     Home Layout: One level     Bathroom Shower/Tub: Tub/shower unit;Curtain         Home Equipment: Agricultural consultant (2 wheels);Wheelchair - manual;Wheelchair - power;BSC/3in1 (adjustable bed)   Additional Comments: [facility] for PT and OT, has aide 15 hours 11-1:30 M-F; states after rehab last year had been using drop arm BSC and got one but equipment company took it back since insurance would not pay for a new one. Pt has a slidding board which they were attempting to use for car transfers.      Prior Functioning/Environment Prior Level of Function : Needs assist             Mobility Comments: since her father passed last December and mother this May she has declined a lot per her report, just started PT at home though not getting up much at all ADLs Comments: aide helps with bed bath    OT Problem List: Decreased strength;Decreased activity tolerance;Impaired balance (sitting and/or standing);Decreased safety awareness;Decreased knowledge of use of DME or AE;Pain   OT Treatment/Interventions: Self-care/ADL training;Therapeutic exercise;DME and/or AE instruction;Therapeutic activities;Patient/family education;Balance training      OT Goals(Current goals can be found in the care plan section)   Acute Rehab OT Goals Patient Stated Goal: to keep trying OT Goal Formulation: With patient Time  For Goal Achievement: 08/09/24 Potential to Achieve Goals: Good   OT Frequency:  Min 2X/week    Co-evaluation              AM-PAC OT 6 Clicks Daily Activity     Outcome Measure Help from another person eating meals?: None Help from another person taking care of personal grooming?: A Little Help from another person toileting, which includes using toliet, bedpan, or urinal?: Total Help from another person bathing (including washing, rinsing, drying)?: A Lot Help from another person to put on and taking off regular upper body clothing?: A Little Help from another person to put on and taking off regular lower body clothing?: A Lot 6 Click Score: 15   End of Session Nurse Communication: Mobility status  Activity Tolerance: Patient tolerated treatment well Patient left: in bed;with call bell/phone within reach;with bed alarm set (placed in chair position)  OT Visit Diagnosis: Unsteadiness on feet (R26.81);Other abnormalities of gait and mobility (  R26.89);Repeated falls (R29.6);Muscle weakness (generalized) (M62.81);Pain Pain - Right/Left: Right Pain - part of body: Shoulder                Time: 1003-1051 OT Time Calculation (min): 48 min Charges:  OT General Charges $OT Visit: 1 Visit OT Evaluation $OT Eval Low Complexity: 1 Low OT Treatments $Self Care/Home Management : 23-37 mins  Warrick POUR OTR/L  Acute Rehab Services  [phone] office number   Warrick Berber 07/26/2024, 10:59 AM

## 2024-07-26 NOTE — Plan of Care (Signed)

## 2024-07-26 NOTE — TOC Progression Note (Addendum)
 Transition of Care [facility]) - Progression Note    Patient Details  Name: Lauren Curry MRN: [id number] Date of Birth: 12/14/1961  Transition of Care [facility]) CM/SW Contact  Alfonse JONELLE Rex, RN Phone Number: 07/26/2024, 1:16 PM  Clinical Narrative:   Met with patient at bedside to review SNF bed offers [facility], [address], [place], [place] , [place], [place], [place], [place], [place], [place]), patient accepted bed offer at [facility], [place], admit coordinator with [facility] notified. SNF auth initiated via [facility], Auth ID [id number], shara pending.   -1:39pm Per [facility], SNF auth approved. Auth ID [id number], days approved. 07/26/2024-07/28/2024     Expected Discharge Plan: Skilled Nursing Facility Barriers to Discharge: Continued Medical Work up               Expected Discharge Plan and Services       Living arrangements for the past 2 months: Single Family Home                                       Social Drivers of Health (SDOH) Interventions SDOH Screenings   Food Insecurity: No Food Insecurity (07/23/2024)  Housing: Low Risk  (07/23/2024)  Transportation Needs: No Transportation Needs (07/23/2024)  Utilities: Not At Risk (07/23/2024)  Financial Resource Strain: Not on File (02/28/2020)   [facility]  Physical Activity: Not on File (02/28/2020)   [facility] from [facility]  Social Connections: Moderately Integrated (07/23/2024)  Stress: Not on File (02/28/2020)   Received from [facility]  Tobacco Use: Low Risk  (07/23/2024)    Readmission Risk Interventions    07/25/2024    2:10 PM  Readmission Risk Prevention Plan  Post Dischage Appt Complete  Medication Screening Complete  Transportation Screening Complete

## 2024-07-27 DIAGNOSIS — I10 Essential (primary) hypertension: Secondary | ICD-10-CM | POA: Diagnosis not present

## 2024-07-27 DIAGNOSIS — R2689 Other abnormalities of gait and mobility: Secondary | ICD-10-CM | POA: Diagnosis not present

## 2024-07-27 DIAGNOSIS — N390 Urinary tract infection, site not specified: Secondary | ICD-10-CM | POA: Diagnosis not present

## 2024-07-27 DIAGNOSIS — E876 Hypokalemia: Secondary | ICD-10-CM | POA: Diagnosis not present

## 2024-07-27 DIAGNOSIS — N3000 Acute cystitis without hematuria: Secondary | ICD-10-CM | POA: Diagnosis not present

## 2024-07-27 DIAGNOSIS — M5459 Other low back pain: Secondary | ICD-10-CM | POA: Diagnosis not present

## 2024-07-27 DIAGNOSIS — Z741 Need for assistance with personal care: Secondary | ICD-10-CM | POA: Diagnosis not present

## 2024-07-27 DIAGNOSIS — G35 Multiple sclerosis: Secondary | ICD-10-CM | POA: Diagnosis not present

## 2024-07-27 DIAGNOSIS — M6281 Muscle weakness (generalized): Secondary | ICD-10-CM | POA: Diagnosis not present

## 2024-07-27 MED ORDER — CEFADROXIL 500 MG PO CAPS: 1000.0000 mg | ORAL_CAPSULE | Freq: Two times a day (BID) | ORAL | 0 refills | Status: AC

## 2024-07-27 MED ORDER — SENNOSIDES-DOCUSATE SODIUM 8.6-50 MG PO TABS: 1.0000 | ORAL_TABLET | Freq: Two times a day (BID) | ORAL | Status: AC

## 2024-07-27 NOTE — Discharge Summary (Signed)
 Physician Discharge Summary   Patient: Lauren Curry MRN: [id number] DOB: 01/15/1961  Admit date:     07/23/2024  Discharge date: 07/27/24  Discharge Physician: Garnette Pelt   PCP: Pcp, No   Recommendations at discharge:    Follow up with PCP In 1-2 weeks  Discharge Diagnoses: Principal Problem:   Acute cystitis Active Problems:   Multiple sclerosis (HCC)   Hypertension  Resolved Problems:   * No resolved hospital problems. [facility] Course: Lauren Curry is a 63 y.o. female with PMH MS on Kesimpta with LLE weakness/bedbound status, hx UTIs, HTN who presented with concern for ongoing UTI. She had a telehealth visit at home approximately a couple weeks ago and was prescribed Bactrim  initially for a UTI.  She had a rash and was then changed to Keflex .  She also states her neurologist had also ordered a urinalysis from her home health nurse during this timeframe.  Results are unavailable in epic. Despite Keflex  course, she states lower abdominal pressure lingered and worsened after completing her course.  Symptoms are similar to UTIs she has had in the past.  She therefore presented for further workup. UA on admission notable for large LE, negative nitrite, greater than 50 WBC, many bacteria, budding yeast.  She was started on Rocephin .  Assessment and Plan: Acute cystitis - Symptoms include lower abdominal pressure/fullness and increased urinary frequency -UA consistent with infection: Large LE, negative nitrite, greater than 50 WBC, many bacteria, budding yeast -Initially started on Rocephin  on admission.  Later added fluconazole  given presence of yeast and persistence of symptoms at home despite Keflex  course - Follow-up pending urine culture; Proteus with sensitivities reviewed.  Okay to transition to cefadroxil  and complete 7 days total given failure outpt treatment prior and underlying MS - Dr. Patsy confirmed with micro that the send out for the yeast did not grow anything  so we will discontinue fluconazole  as well   Hypertension - Continue HCTZ   Multiple sclerosis (HCC) - Follows with neurology in [place] -Recent MRI brain and C-spine ordered.  Follow-up results with neurology - She is on Kesimpta - Main neurologic deficit appears to be left lower extremity weakness.  She has been bedbound for approximately 1 year - Evaluated by PT/OT with recommendations for SNF    Consultants:  Procedures performed:   Disposition: Skilled nursing facility Diet recommendation:  Regular diet DISCHARGE MEDICATION: Allergies as of 07/27/2024       Reactions   Bactrim  [sulfamethoxazole -trimethoprim ] Rash        Medication List     STOP taking these medications    diphenhydrAMINE  50 MG tablet Commonly known as: BENADRYL    Macrobid  100 MG capsule Generic drug: nitrofurantoin  (macrocrystal-monohydrate)       TAKE these medications    acetaminophen  650 MG CR tablet Commonly known as: TYLENOL  Take 1,300 mg by mouth daily as needed for pain.   cefadroxil  500 MG capsule Commonly known as: DURICEF Take 2 capsules (1,000 mg total) by mouth 2 (two) times daily for 3 days.   hydrochlorothiazide  12.5 MG tablet Commonly known as: HYDRODIURIL  Take 12.5 mg by mouth daily.   Kesimpta 20 MG/0.4ML Soaj Generic drug: Ofatumumab Inject 20 mg into the skin every 30 (thirty) days.   potassium chloride  10 MEQ tablet Commonly known as: KLOR-CON  Take 2 tablets by mouth every morning.   senna-docusate 8.6-50 MG tablet Commonly known as: Senokot-S Take 1 tablet by mouth 2 (two) times daily.        Contact  information for after-discharge care     Destination     [facility]   Service: Skilled Nursing Contact information: [address] [place]  [address] [phone]                    Discharge Exam: Fredricka Weights   07/23/24 0809  Weight: 127 kg   General exam: Awake, laying in bed, in  nad Respiratory system: Normal respiratory effort, no wheezing Cardiovascular system: regular rate, s1, s2 Gastrointestinal system: Soft, nondistended, positive BS Central nervous system: CN2-12 grossly intact, strength intact Extremities: Perfused, no clubbing Skin: Normal skin turgor, no notable skin lesions seen Psychiatry: Mood normal // no visual hallucinations   Condition at discharge: fair  The results of significant diagnostics from this hospitalization (including imaging, microbiology, ancillary and laboratory) are listed below for reference.   Imaging Studies: MR BRAIN WO CONTRAST Result Date: 07/23/2024 CLINICAL DATA:  Provided history: Multiple sclerosis. EXAM: MRI HEAD WITHOUT CONTRAST MRI CERVICAL SPINE WITHOUT CONTRAST TECHNIQUE: Multiplanar, multiecho pulse sequences of the brain and surrounding structures, and cervical spine, to include the craniocervical junction and cervicothoracic junction, were obtained without intravenous contrast. CONTRAST:  10 mL Gadavist  intravenous contrast. COMPARISON:  Brain MRI 10/05/2022.  Cervical spine MRI 10/05/2022. FINDINGS: MRI HEAD FINDINGS Brain: No age-advanced or lobar predominant cerebral atrophy. Small foci of T2 FLAIR hyperintense signal abnormality scattered within the bilateral subcortical and deep periventricular cerebral white matter, nonspecific. No lesions are identified within the posterior fossa. As compared to the MRI of 10/05/2022, no new lesion is identified. No cortical encephalomalacia is identified. There is no acute infarct. No evidence of an intracranial mass. No chronic intracranial blood products. No extra-axial fluid collection. No midline shift. Vascular: Maintained flow voids within the proximal large arterial vessels. Skull and upper cervical spine: No focal worrisome marrow lesion. Sinuses/Orbits: No mass or acute finding within the imaged orbits. No significant paranasal sinus disease. Other: Trace fluid within  bilateral mastoid air cells. MRI CERVICAL SPINE FINDINGS Alignment: Nonspecific reversal of the expected cervical lordosis. No significant spondylolisthesis. Vertebrae: Cervical vertebral body height is maintained. No significant marrow edema or focal worrisome marrow lesion. Cord: T2 hyperintense signal abnormality within the spinal cord at the C6-C7 level (with associated cord volume loss), unchanged from the prior cervical spine MRI of 10/05/2022. No new foci of signal abnormality identified elsewhere within the cervical spinal cord. Mild spinal cord flattening at C5-C6 and C6-C7, as described below. Posterior Fossa, vertebral arteries, paraspinal tissues: Posterior fossa assessed on same-day brain MRI. Flow voids preserved within visible portions of the cervical vertebral arteries. No paraspinal mass or collection. Disc levels: Unless otherwise stated, the level by level findings below have not significantly changed from the prior MRI of 10/05/2022. Multilevel disc degeneration, greatest at C5-C6 and C6-C7 (moderate-to-advanced at these levels). C2-C3: Slight disc bulge. No significant spinal canal or foraminal stenosis. C3-C4: Slight disc bulge. No significant spinal canal or foraminal stenosis. C4-C5: Slight disc bulge. No significant spinal canal or foraminal stenosis. C5-C6: Posterior disc osteophyte complex with right greater than left disc osteophyte ridge/uncinate hypertrophy. Mild relative spinal canal stenosis. The posterior disc osteophyte complex effaces the ventral thecal sac and slightly flattens the ventral aspect the spinal cord. Moderate right neural foraminal narrowing. C6-C7: Posterior disc osteophyte complex with bilateral disc osteophyte ridge/uncinate hypertrophy. Ligamentum flavum thickening, progressed. Mild-to-moderate spinal canal stenosis with mild spinal cord flattening, progressed. Bilateral neural foraminal narrowing (moderate right, severe left). C7-T1:  No significant disc  herniation or stenosis. IMPRESSION: MRI brain: 1. No evidence of an acute intracranial abnormality. 2. Small scattered foci of T2 FLAIR hyperintense signal abnormality within the bilateral subcortical and deep periventricular cerebral white matter. Findings are nonspecific, but could reflect sequela of demyelinating disease given the provided history of multiple sclerosis. Assessment for active demyelination is limited on this non-contrast exam. MRI cervical spine: 1. T2 hyperintense signal abnormality within the spinal cord at the C6-C7 level (with associated cord volume loss), unchanged from the MRI of 10/05/2022. Although nonspecific, this could reflect a chronic demyelinating lesion given the provided history of multiple sclerosis. No new lesions identified elsewhere within the cervical spinal cord. Assessment for active demyelination is limited on this non-contrast exam. 2. Cervical spondylosis as outlined within the body of the report. 3. At C6-C7, there is moderate-to-advanced disc degeneration. Multifactorial mild-to-moderate spinal canal stenosis (with mild spinal cord flattening), progressed. Bilateral neural foraminal narrowing (moderate right, severe left). 4. At C5-C6, there is moderate-to-advanced disc degeneration. A posterior disc osteophyte complex results in mild relative spinal canal stenosis (and mildly flattens the ventral aspect the spinal cord). Moderate right neural foraminal narrowing also present at this level. Electronically Signed   By: Rockey Childs D.O.   On: 07/23/2024 14:57   MR CERVICAL SPINE WO CONTRAST Result Date: 07/23/2024 CLINICAL DATA:  Provided history: Multiple sclerosis. EXAM: MRI HEAD WITHOUT CONTRAST MRI CERVICAL SPINE WITHOUT CONTRAST TECHNIQUE: Multiplanar, multiecho pulse sequences of the brain and surrounding structures, and cervical spine, to include the craniocervical junction and cervicothoracic junction, were obtained without intravenous contrast. CONTRAST:  10  mL Gadavist  intravenous contrast. COMPARISON:  Brain MRI 10/05/2022.  Cervical spine MRI 10/05/2022. FINDINGS: MRI HEAD FINDINGS Brain: No age-advanced or lobar predominant cerebral atrophy. Small foci of T2 FLAIR hyperintense signal abnormality scattered within the bilateral subcortical and deep periventricular cerebral white matter, nonspecific. No lesions are identified within the posterior fossa. As compared to the MRI of 10/05/2022, no new lesion is identified. No cortical encephalomalacia is identified. There is no acute infarct. No evidence of an intracranial mass. No chronic intracranial blood products. No extra-axial fluid collection. No midline shift. Vascular: Maintained flow voids within the proximal large arterial vessels. Skull and upper cervical spine: No focal worrisome marrow lesion. Sinuses/Orbits: No mass or acute finding within the imaged orbits. No significant paranasal sinus disease. Other: Trace fluid within bilateral mastoid air cells. MRI CERVICAL SPINE FINDINGS Alignment: Nonspecific reversal of the expected cervical lordosis. No significant spondylolisthesis. Vertebrae: Cervical vertebral body height is maintained. No significant marrow edema or focal worrisome marrow lesion. Cord: T2 hyperintense signal abnormality within the spinal cord at the C6-C7 level (with associated cord volume loss), unchanged from the prior cervical spine MRI of 10/05/2022. No new foci of signal abnormality identified elsewhere within the cervical spinal cord. Mild spinal cord flattening at C5-C6 and C6-C7, as described below. Posterior Fossa, vertebral arteries, paraspinal tissues: Posterior fossa assessed on same-day brain MRI. Flow voids preserved within visible portions of the cervical vertebral arteries. No paraspinal mass or collection. Disc levels: Unless otherwise stated, the level by level findings below have not significantly changed from the prior MRI of 10/05/2022. Multilevel disc degeneration,  greatest at C5-C6 and C6-C7 (moderate-to-advanced at these levels). C2-C3: Slight disc bulge. No significant spinal canal or foraminal stenosis. C3-C4: Slight disc bulge. No significant spinal canal or foraminal stenosis. C4-C5: Slight disc bulge. No significant spinal canal or foraminal stenosis. C5-C6: Posterior disc osteophyte complex with right greater  than left disc osteophyte ridge/uncinate hypertrophy. Mild relative spinal canal stenosis. The posterior disc osteophyte complex effaces the ventral thecal sac and slightly flattens the ventral aspect the spinal cord. Moderate right neural foraminal narrowing. C6-C7: Posterior disc osteophyte complex with bilateral disc osteophyte ridge/uncinate hypertrophy. Ligamentum flavum thickening, progressed. Mild-to-moderate spinal canal stenosis with mild spinal cord flattening, progressed. Bilateral neural foraminal narrowing (moderate right, severe left). C7-T1: No significant disc herniation or stenosis. IMPRESSION: MRI brain: 1. No evidence of an acute intracranial abnormality. 2. Small scattered foci of T2 FLAIR hyperintense signal abnormality within the bilateral subcortical and deep periventricular cerebral white matter. Findings are nonspecific, but could reflect sequela of demyelinating disease given the provided history of multiple sclerosis. Assessment for active demyelination is limited on this non-contrast exam. MRI cervical spine: 1. T2 hyperintense signal abnormality within the spinal cord at the C6-C7 level (with associated cord volume loss), unchanged from the MRI of 10/05/2022. Although nonspecific, this could reflect a chronic demyelinating lesion given the provided history of multiple sclerosis. No new lesions identified elsewhere within the cervical spinal cord. Assessment for active demyelination is limited on this non-contrast exam. 2. Cervical spondylosis as outlined within the body of the report. 3. At C6-C7, there is moderate-to-advanced disc  degeneration. Multifactorial mild-to-moderate spinal canal stenosis (with mild spinal cord flattening), progressed. Bilateral neural foraminal narrowing (moderate right, severe left). 4. At C5-C6, there is moderate-to-advanced disc degeneration. A posterior disc osteophyte complex results in mild relative spinal canal stenosis (and mildly flattens the ventral aspect the spinal cord). Moderate right neural foraminal narrowing also present at this level. Electronically Signed   By: Rockey Childs D.O.   On: 07/23/2024 14:57    Microbiology: Results for orders placed or performed during the hospital encounter of 07/23/24  Urine Culture     Status: Abnormal   Collection Time: 07/23/24  8:17 AM   Specimen: Urine, Clean Catch  Result Value Ref Range Status   Specimen Description   Final    URINE, CLEAN CATCH Performed at [facility], [address] [place], [place] [address]    Special Requests   Final    NONE Performed at [facility], [address] [place], [place] [address]    Culture (A)  Final    80,000 COLONIES/mL PROTEUS MIRABILIS NG OF YEAST Performed at [facility], [address] [place], [place] [address]    Report Status 07/26/2024 FINAL  Final   Organism ID, Bacteria PROTEUS MIRABILIS (A)  Final      Susceptibility   Proteus mirabilis - MIC*    AMPICILLIN <=2 SENSITIVE Sensitive     CEFAZOLIN <=4 SENSITIVE Sensitive     CEFEPIME 0.25 SENSITIVE Sensitive     CEFTRIAXONE  <=0.25 SENSITIVE Sensitive     CIPROFLOXACIN <=0.25 SENSITIVE Sensitive     GENTAMICIN <=1 SENSITIVE Sensitive     IMIPENEM 8 INTERMEDIATE Intermediate     NITROFURANTOIN  >=512 RESISTANT Resistant     TRIMETH /SULFA  <=20 SENSITIVE Sensitive     AMPICILLIN/SULBACTAM <=2 SENSITIVE Sensitive     PIP/TAZO <=4 SENSITIVE Sensitive ug/mL    * 80,000 COLONIES/mL PROTEUS MIRABILIS    Labs: CBC: Recent Labs  Lab 07/23/24 0850 07/24/24 0513  WBC 10.3 7.0   NEUTROABS 7.8*  --   HGB 14.2 12.8  HCT 45.8 41.6  MCV 89.8 90.4  PLT 328 281   Basic Metabolic Panel: Recent Labs  Lab 07/23/24 0850 07/24/24 0513  NA 138 143  K  4.0 3.7  CL 110 108  CO2 23 24  GLUCOSE 94 99  BUN 27* 22  CREATININE 0.80 0.70  CALCIUM 9.4 9.6   Liver Function Tests: Recent Labs  Lab 07/23/24 0850  AST 24  ALT 16  ALKPHOS 98  BILITOT 0.9  PROT 7.4  ALBUMIN 3.8   CBG: No results for input(s): GLUCAP in the last 168 hours.  Discharge time spent: less than 30 minutes.  Signed: Garnette Pelt, MD [facility] 07/27/2024

## 2024-07-27 NOTE — Progress Notes (Signed)
 Occupational Therapy Treatment Patient Details Name: Lauren Curry MRN: [id number] DOB: 03/14/1961 Today's Date: 07/27/2024   History of present illness Patient is a 63 yr old female admitted 07/23/24 with back pain, weakness and UTI which failed outpatient treatment.  PMH positive for HTN and multiple sclerosis with significant L >R LE weakness.   OT comments  Pt was motivated to participate in the session and was seen for functional strengthening and progression of ADL participation. She was assisted to the bedside commode via use of Stedy device. She required total assist for toileting management. She had difficulty standing from the lower bedside commode surface, and therefore required +4 assist to do so. She reported feelings of increased low back pain and BLE weakness. Continue OT plan of care. Patient will benefit from continued inpatient follow up therapy, <3 hours/day       If plan is discharge home, recommend the following:  Two people to help with walking and/or transfers;Two people to help with bathing/dressing/bathroom;Assistance with cooking/housework;Assist for transportation;Help with stairs or ramp for entrance   Equipment Recommendations  Other (comment) (defer to next level of care)    Recommendations for Other Services      Precautions / Restrictions Precautions Precautions: Fall Restrictions Weight Bearing Restrictions Per Provider Order: No       Mobility Bed Mobility Overal bed mobility: Needs Assistance Bed Mobility: Sit to Supine     Supine to sit: Min assist, Used rails, HOB elevated          Transfers Overall transfer level: Needs assistance Equipment used: None (Stedy) Transfers: Sit to/from Stand, Bed to chair/wheelchair/BSC Sit to Stand: Max assist, +2 physical assistance, Via lift equipment, +2 safety/equipment, From elevated surface           General transfer comment: required instruction on BLE placement on foot plate, trunk shifting  forward for momentum, pulling on support bar and pushing with BLE.     Balance     Sitting balance-Leahy Scale: Fair       Standing balance-Leahy Scale: Zero            ADL either performed or assessed with clinical judgement   ADL Overall ADL's : Needs assistance/impaired          Toilet Transfer: Maximal assistance;BSC/3in1;Cueing for sequencing Toilet Transfer Details (indicate cue type and reason): The pt was assisted to the bedside commode via transfer with [facility] device. Toileting- Clothing Manipulation and Hygiene: Total assistance Toileting - Clothing Manipulation Details (indicate cue type and reason): Pt required total assist for posterior peri-hygiene in sitting and max assist for clothing management.             Communication Communication Communication: No apparent difficulties   Cognition Arousal: Alert Behavior During Therapy: WFL for tasks assessed/performed Cognition: No apparent impairments          Following commands: Intact                      Pertinent Vitals/ Pain       Pain Assessment Pain Assessment: 0-10 Pain Score: 5  Pain Location: R shoulder and low back Pain Descriptors / Indicators: Grimacing, Guarding, Sore, Aching Pain Intervention(s): Limited activity within patient's tolerance, Monitored during session, Repositioned, Heat applied   Frequency  Min 2X/week        Progress Toward Goals  OT Goals(current goals can now be found in the care plan section)     Acute Rehab OT Goals Patient Stated Goal: to  get stronger OT Goal Formulation: With patient Time For Goal Achievement: 08/09/24 Potential to Achieve Goals: Good  Plan         AM-PAC OT 6 Clicks Daily Activity     Outcome Measure   Help from another person eating meals?: None Help from another person taking care of personal grooming?: A Little Help from another person toileting, which includes using toliet, bedpan, or urinal?: Total Help from  another person bathing (including washing, rinsing, drying)?: A Lot Help from another person to put on and taking off regular upper body clothing?: A Little Help from another person to put on and taking off regular lower body clothing?: A Lot 6 Click Score: 15    End of Session Equipment Utilized During Treatment: Gait belt;Other (comment) Laurent)  OT Visit Diagnosis: Unsteadiness on feet (R26.81);Other abnormalities of gait and mobility (R26.89);Muscle weakness (generalized) (M62.81);Pain;History of falling (Z91.81) Pain - Right/Left: Right Pain - part of body: Shoulder   Activity Tolerance Patient limited by pain   Patient Left in chair;with call bell/phone within reach;with chair alarm set   Nurse Communication Mobility status;Need for lift equipment        Time: 1005-1101 OT Time Calculation (min): 56 min  Charges: OT General Charges $OT Visit: 1 Visit OT Treatments $Self Care/Home Management : 23-37 mins $Therapeutic Activity: 23-37 mins     Delanna LITTIE Molt, OTR/L 07/27/2024, 3:35 PM

## 2024-07-27 NOTE — Progress Notes (Signed)
 Physical Therapy Treatment Patient Details Name: Lauren Curry MRN: [id number] DOB: [birth year] Today's Date: 07/27/2024   History of Present Illness Patient is a 63 y/o female admitted 07/23/24 with back pain, weakness and UTI which failed outpatient treatment.  PMH positive for HTN and multiple sclerosis with significant L >R LE weakness.    PT Comments  Assisted with transfer from recliner to bed via anterior slide with +3 assist. Patient tolerated well, able to assist with UE's. Patient will benefit from continued inpatient follow up therapy, <3 hours/day     If plan is discharge home, recommend the following: A lot of help with bathing/dressing/bathroom;Two people to help with walking and/or transfers   Can travel by private vehicle     No  Equipment Recommendations  Deitra lift    Recommendations for Other Services       Precautions / Restrictions Precautions Precautions: Fall Recall of Precautions/Restrictions: Intact Restrictions Weight Bearing Restrictions Per Provider Order: No     Mobility  Bed Mobility Overal bed mobility: Needs Assistance             General bed mobility comments: able to lean forward in recliner with mod support    Transfers   Equipment used:  (maxi slide sheet)   Sit to Stand:  (+3)           General transfer comment: patient recliner positioned  for anterior approach transfer, maxislide sheet placed under bed pads. Patient  able to sit upright and  push through arms on rests to assists with anterior transfer, +3 assist to slide onto bed and turn to supine.    Ambulation/Gait                   Stairs             Wheelchair Mobility     Tilt Bed    Modified Rankin (Stroke Patients Only)       Balance Overall balance assessment: Needs assistance Sitting-balance support: Feet supported Sitting balance-Leahy Scale: Fair Sitting balance - Comments: in recliner, more forward pressed  due to body habitus                                     Communication Communication Communication: No apparent difficulties  Cognition Arousal: Alert Behavior During Therapy: WFL for tasks assessed/performed   PT - Cognitive impairments: No apparent impairments                         Following commands: Intact      Cueing Cueing Techniques: Verbal cues  Exercises      General Comments        Pertinent Vitals/Pain Pain Assessment Faces Pain Scale: Hurts little more Pain Location: R shoulder, abdomen Pain Descriptors / Indicators: Grimacing, Guarding, Sore, Aching Pain Intervention(s): Monitored during session    Home Living                          Prior Function            PT Goals (current goals can now be found in the care plan section) Progress towards PT goals: Progressing toward goals    Frequency    Min 2X/week      PT Plan      Co-evaluation  AM-PAC PT 6 Clicks Mobility   Outcome Measure  Help needed turning from your back to your side while in a flat bed without using bedrails?: A Lot Help needed moving from lying on your back to sitting on the side of a flat bed without using bedrails?: A Lot Help needed moving to and from a bed to a chair (including a wheelchair)?: Total Help needed standing up from a chair using your arms (e.g., wheelchair or bedside chair)?: Total Help needed to walk in hospital room?: Total   6 Click Score: 7    End of Session   Activity Tolerance: Patient tolerated treatment well Patient left: in bed;with call bell/phone within reach;with nursing/sitter in room Nurse Communication: Mobility status;Need for lift equipment PT Visit Diagnosis: Other abnormalities of gait and mobility (R26.89);Muscle weakness (generalized) (M62.81);History of falling (Z91.81);Pain     Time: 1345-1402 PT Time Calculation (min) (ACUTE ONLY): 17 min  Charges:    $Therapeutic Activity: 8-22 mins PT  General Charges $$ ACUTE PT VISIT: 1 Visit                     Darice Potters PT Acute Rehabilitation Services Office [phone]    Potters Darice Norris 07/27/2024, 2:26 PM

## 2024-07-29 DIAGNOSIS — N390 Urinary tract infection, site not specified: Secondary | ICD-10-CM | POA: Diagnosis not present

## 2024-07-29 DIAGNOSIS — M549 Dorsalgia, unspecified: Secondary | ICD-10-CM | POA: Diagnosis not present

## 2024-07-29 DIAGNOSIS — Z7401 Bed confinement status: Secondary | ICD-10-CM | POA: Diagnosis not present

## 2024-07-29 DIAGNOSIS — G35 Multiple sclerosis: Secondary | ICD-10-CM | POA: Diagnosis not present

## 2024-07-29 DIAGNOSIS — R531 Weakness: Secondary | ICD-10-CM | POA: Diagnosis not present

## 2024-08-01 DIAGNOSIS — Z7401 Bed confinement status: Secondary | ICD-10-CM | POA: Diagnosis not present

## 2024-08-01 DIAGNOSIS — G35 Multiple sclerosis: Secondary | ICD-10-CM | POA: Diagnosis not present

## 2024-08-01 DIAGNOSIS — M549 Dorsalgia, unspecified: Secondary | ICD-10-CM | POA: Diagnosis not present

## 2024-08-01 DIAGNOSIS — N390 Urinary tract infection, site not specified: Secondary | ICD-10-CM | POA: Diagnosis not present

## 2024-08-01 DIAGNOSIS — R531 Weakness: Secondary | ICD-10-CM | POA: Diagnosis not present

## 2024-08-02 DIAGNOSIS — G35 Multiple sclerosis: Secondary | ICD-10-CM | POA: Diagnosis not present

## 2024-08-02 DIAGNOSIS — N390 Urinary tract infection, site not specified: Secondary | ICD-10-CM | POA: Diagnosis not present

## 2024-08-02 DIAGNOSIS — I10 Essential (primary) hypertension: Secondary | ICD-10-CM | POA: Diagnosis not present

## 2024-08-02 DIAGNOSIS — N3000 Acute cystitis without hematuria: Secondary | ICD-10-CM | POA: Diagnosis not present

## 2024-08-02 DIAGNOSIS — E876 Hypokalemia: Secondary | ICD-10-CM | POA: Diagnosis not present

## 2024-08-02 DIAGNOSIS — Z741 Need for assistance with personal care: Secondary | ICD-10-CM | POA: Diagnosis not present

## 2024-08-02 DIAGNOSIS — M6281 Muscle weakness (generalized): Secondary | ICD-10-CM | POA: Diagnosis not present

## 2024-08-02 DIAGNOSIS — R2689 Other abnormalities of gait and mobility: Secondary | ICD-10-CM | POA: Diagnosis not present

## 2024-08-02 DIAGNOSIS — M5459 Other low back pain: Secondary | ICD-10-CM | POA: Diagnosis not present

## 2024-08-03 DIAGNOSIS — I10 Essential (primary) hypertension: Secondary | ICD-10-CM | POA: Diagnosis not present

## 2024-08-03 DIAGNOSIS — N390 Urinary tract infection, site not specified: Secondary | ICD-10-CM | POA: Diagnosis not present

## 2024-08-03 DIAGNOSIS — M6281 Muscle weakness (generalized): Secondary | ICD-10-CM | POA: Diagnosis not present

## 2024-08-03 DIAGNOSIS — Z5189 Encounter for other specified aftercare: Secondary | ICD-10-CM | POA: Diagnosis not present

## 2024-08-03 DIAGNOSIS — G35 Multiple sclerosis: Secondary | ICD-10-CM | POA: Diagnosis not present

## 2024-08-03 DIAGNOSIS — B3749 Other urogenital candidiasis: Secondary | ICD-10-CM | POA: Diagnosis not present

## 2024-08-05 DIAGNOSIS — R2689 Other abnormalities of gait and mobility: Secondary | ICD-10-CM | POA: Diagnosis not present

## 2024-08-05 DIAGNOSIS — N390 Urinary tract infection, site not specified: Secondary | ICD-10-CM | POA: Diagnosis not present

## 2024-08-05 DIAGNOSIS — Z741 Need for assistance with personal care: Secondary | ICD-10-CM | POA: Diagnosis not present

## 2024-08-05 DIAGNOSIS — I10 Essential (primary) hypertension: Secondary | ICD-10-CM | POA: Diagnosis not present

## 2024-08-05 DIAGNOSIS — G35 Multiple sclerosis: Secondary | ICD-10-CM | POA: Diagnosis not present

## 2024-08-05 DIAGNOSIS — Z09 Encounter for follow-up examination after completed treatment for conditions other than malignant neoplasm: Secondary | ICD-10-CM | POA: Diagnosis not present

## 2024-08-05 DIAGNOSIS — N3000 Acute cystitis without hematuria: Secondary | ICD-10-CM | POA: Diagnosis not present

## 2024-08-05 DIAGNOSIS — M6281 Muscle weakness (generalized): Secondary | ICD-10-CM | POA: Diagnosis not present

## 2024-08-08 DIAGNOSIS — N390 Urinary tract infection, site not specified: Secondary | ICD-10-CM | POA: Diagnosis not present

## 2024-08-08 DIAGNOSIS — M6281 Muscle weakness (generalized): Secondary | ICD-10-CM | POA: Diagnosis not present

## 2024-08-08 DIAGNOSIS — G35 Multiple sclerosis: Secondary | ICD-10-CM | POA: Diagnosis not present

## 2024-08-08 DIAGNOSIS — I10 Essential (primary) hypertension: Secondary | ICD-10-CM | POA: Diagnosis not present

## 2024-08-09 DIAGNOSIS — M6281 Muscle weakness (generalized): Secondary | ICD-10-CM | POA: Diagnosis not present

## 2024-08-09 DIAGNOSIS — R2689 Other abnormalities of gait and mobility: Secondary | ICD-10-CM | POA: Diagnosis not present

## 2024-08-09 DIAGNOSIS — Z741 Need for assistance with personal care: Secondary | ICD-10-CM | POA: Diagnosis not present

## 2024-08-09 DIAGNOSIS — N3000 Acute cystitis without hematuria: Secondary | ICD-10-CM | POA: Diagnosis not present

## 2024-08-09 DIAGNOSIS — G35 Multiple sclerosis: Secondary | ICD-10-CM | POA: Diagnosis not present

## 2024-08-12 DIAGNOSIS — Z741 Need for assistance with personal care: Secondary | ICD-10-CM | POA: Diagnosis not present

## 2024-08-12 DIAGNOSIS — M6281 Muscle weakness (generalized): Secondary | ICD-10-CM | POA: Diagnosis not present

## 2024-08-12 DIAGNOSIS — R2689 Other abnormalities of gait and mobility: Secondary | ICD-10-CM | POA: Diagnosis not present

## 2024-08-12 DIAGNOSIS — I10 Essential (primary) hypertension: Secondary | ICD-10-CM | POA: Diagnosis not present

## 2024-08-12 DIAGNOSIS — G35 Multiple sclerosis: Secondary | ICD-10-CM | POA: Diagnosis not present

## 2024-08-12 DIAGNOSIS — N390 Urinary tract infection, site not specified: Secondary | ICD-10-CM | POA: Diagnosis not present

## 2024-08-12 DIAGNOSIS — N3000 Acute cystitis without hematuria: Secondary | ICD-10-CM | POA: Diagnosis not present

## 2024-08-16 DIAGNOSIS — N3000 Acute cystitis without hematuria: Secondary | ICD-10-CM | POA: Diagnosis not present

## 2024-08-16 DIAGNOSIS — M6281 Muscle weakness (generalized): Secondary | ICD-10-CM | POA: Diagnosis not present

## 2024-08-16 DIAGNOSIS — G35 Multiple sclerosis: Secondary | ICD-10-CM | POA: Diagnosis not present

## 2024-08-16 DIAGNOSIS — Z741 Need for assistance with personal care: Secondary | ICD-10-CM | POA: Diagnosis not present

## 2024-08-16 DIAGNOSIS — R2689 Other abnormalities of gait and mobility: Secondary | ICD-10-CM | POA: Diagnosis not present

## 2024-08-19 DIAGNOSIS — G35 Multiple sclerosis: Secondary | ICD-10-CM | POA: Diagnosis not present

## 2024-08-19 DIAGNOSIS — I10 Essential (primary) hypertension: Secondary | ICD-10-CM | POA: Diagnosis not present

## 2024-08-19 DIAGNOSIS — N390 Urinary tract infection, site not specified: Secondary | ICD-10-CM | POA: Diagnosis not present

## 2024-08-23 DIAGNOSIS — R2689 Other abnormalities of gait and mobility: Secondary | ICD-10-CM | POA: Diagnosis not present

## 2024-08-23 DIAGNOSIS — G35 Multiple sclerosis: Secondary | ICD-10-CM | POA: Diagnosis not present

## 2024-08-23 DIAGNOSIS — M6281 Muscle weakness (generalized): Secondary | ICD-10-CM | POA: Diagnosis not present

## 2024-08-23 DIAGNOSIS — N3000 Acute cystitis without hematuria: Secondary | ICD-10-CM | POA: Diagnosis not present

## 2024-08-23 DIAGNOSIS — Z741 Need for assistance with personal care: Secondary | ICD-10-CM | POA: Diagnosis not present

## 2024-08-26 DIAGNOSIS — M6281 Muscle weakness (generalized): Secondary | ICD-10-CM | POA: Diagnosis not present

## 2024-08-26 DIAGNOSIS — G35 Multiple sclerosis: Secondary | ICD-10-CM | POA: Diagnosis not present

## 2024-08-26 DIAGNOSIS — Z741 Need for assistance with personal care: Secondary | ICD-10-CM | POA: Diagnosis not present

## 2024-08-26 DIAGNOSIS — R2689 Other abnormalities of gait and mobility: Secondary | ICD-10-CM | POA: Diagnosis not present

## 2024-08-26 DIAGNOSIS — N3000 Acute cystitis without hematuria: Secondary | ICD-10-CM | POA: Diagnosis not present

## 2024-08-28 DIAGNOSIS — M6281 Muscle weakness (generalized): Secondary | ICD-10-CM | POA: Diagnosis not present

## 2024-08-28 DIAGNOSIS — N3000 Acute cystitis without hematuria: Secondary | ICD-10-CM | POA: Diagnosis not present

## 2024-08-28 DIAGNOSIS — I10 Essential (primary) hypertension: Secondary | ICD-10-CM | POA: Diagnosis not present

## 2024-08-28 DIAGNOSIS — R2689 Other abnormalities of gait and mobility: Secondary | ICD-10-CM | POA: Diagnosis not present

## 2024-08-29 DIAGNOSIS — N3000 Acute cystitis without hematuria: Secondary | ICD-10-CM | POA: Diagnosis not present

## 2024-08-29 DIAGNOSIS — I10 Essential (primary) hypertension: Secondary | ICD-10-CM | POA: Diagnosis not present

## 2024-08-29 DIAGNOSIS — M6281 Muscle weakness (generalized): Secondary | ICD-10-CM | POA: Diagnosis not present

## 2024-08-29 DIAGNOSIS — R2689 Other abnormalities of gait and mobility: Secondary | ICD-10-CM | POA: Diagnosis not present

## 2024-08-30 DIAGNOSIS — Z741 Need for assistance with personal care: Secondary | ICD-10-CM | POA: Diagnosis not present

## 2024-08-30 DIAGNOSIS — I10 Essential (primary) hypertension: Secondary | ICD-10-CM | POA: Diagnosis not present

## 2024-08-30 DIAGNOSIS — G35 Multiple sclerosis: Secondary | ICD-10-CM | POA: Diagnosis not present

## 2024-08-30 DIAGNOSIS — N3000 Acute cystitis without hematuria: Secondary | ICD-10-CM | POA: Diagnosis not present

## 2024-08-30 DIAGNOSIS — M6281 Muscle weakness (generalized): Secondary | ICD-10-CM | POA: Diagnosis not present

## 2024-08-30 DIAGNOSIS — R2689 Other abnormalities of gait and mobility: Secondary | ICD-10-CM | POA: Diagnosis not present

## 2024-08-31 DIAGNOSIS — N390 Urinary tract infection, site not specified: Secondary | ICD-10-CM | POA: Diagnosis not present

## 2024-08-31 DIAGNOSIS — M6281 Muscle weakness (generalized): Secondary | ICD-10-CM | POA: Diagnosis not present

## 2024-08-31 DIAGNOSIS — N3000 Acute cystitis without hematuria: Secondary | ICD-10-CM | POA: Diagnosis not present

## 2024-08-31 DIAGNOSIS — G35 Multiple sclerosis: Secondary | ICD-10-CM | POA: Diagnosis not present

## 2024-08-31 DIAGNOSIS — R2689 Other abnormalities of gait and mobility: Secondary | ICD-10-CM | POA: Diagnosis not present

## 2024-08-31 DIAGNOSIS — I10 Essential (primary) hypertension: Secondary | ICD-10-CM | POA: Diagnosis not present

## 2024-09-03 DIAGNOSIS — G8929 Other chronic pain: Secondary | ICD-10-CM | POA: Diagnosis not present

## 2024-09-03 DIAGNOSIS — N3000 Acute cystitis without hematuria: Secondary | ICD-10-CM | POA: Diagnosis not present

## 2024-09-03 DIAGNOSIS — I10 Essential (primary) hypertension: Secondary | ICD-10-CM | POA: Diagnosis not present

## 2024-09-03 DIAGNOSIS — Z9049 Acquired absence of other specified parts of digestive tract: Secondary | ICD-10-CM | POA: Diagnosis not present

## 2024-09-03 DIAGNOSIS — G35 Multiple sclerosis: Secondary | ICD-10-CM | POA: Diagnosis not present

## 2024-09-13 DIAGNOSIS — N3000 Acute cystitis without hematuria: Secondary | ICD-10-CM | POA: Diagnosis not present

## 2024-09-13 DIAGNOSIS — I10 Essential (primary) hypertension: Secondary | ICD-10-CM | POA: Diagnosis not present

## 2024-09-13 DIAGNOSIS — Z9049 Acquired absence of other specified parts of digestive tract: Secondary | ICD-10-CM | POA: Diagnosis not present

## 2024-09-13 DIAGNOSIS — G8929 Other chronic pain: Secondary | ICD-10-CM | POA: Diagnosis not present

## 2024-09-13 DIAGNOSIS — G35 Multiple sclerosis: Secondary | ICD-10-CM | POA: Diagnosis not present

## 2024-09-14 DIAGNOSIS — I10 Essential (primary) hypertension: Secondary | ICD-10-CM | POA: Diagnosis not present

## 2024-09-14 DIAGNOSIS — N3000 Acute cystitis without hematuria: Secondary | ICD-10-CM | POA: Diagnosis not present

## 2024-09-14 DIAGNOSIS — Z9049 Acquired absence of other specified parts of digestive tract: Secondary | ICD-10-CM | POA: Diagnosis not present

## 2024-09-14 DIAGNOSIS — G8929 Other chronic pain: Secondary | ICD-10-CM | POA: Diagnosis not present

## 2024-09-14 DIAGNOSIS — G35 Multiple sclerosis: Secondary | ICD-10-CM | POA: Diagnosis not present

## 2024-09-15 DIAGNOSIS — G35 Multiple sclerosis: Secondary | ICD-10-CM | POA: Diagnosis not present

## 2024-10-15 DIAGNOSIS — G35D Multiple sclerosis, unspecified: Secondary | ICD-10-CM | POA: Diagnosis not present

## 2024-10-28 ENCOUNTER — Telehealth: Admitting: Physician Assistant

## 2024-10-28 DIAGNOSIS — H8110 Benign paroxysmal vertigo, unspecified ear: Secondary | ICD-10-CM

## 2024-10-28 DIAGNOSIS — R3989 Other symptoms and signs involving the genitourinary system: Secondary | ICD-10-CM | POA: Diagnosis not present

## 2024-10-28 MED ORDER — MECLIZINE HCL 25 MG PO TABS
25.0000 mg | ORAL_TABLET | Freq: Three times a day (TID) | ORAL | 0 refills | Status: AC | PRN
Start: 2024-10-28 — End: ?

## 2024-10-28 MED ORDER — CEPHALEXIN 500 MG PO CAPS: 500.0000 mg | ORAL_CAPSULE | Freq: Two times a day (BID) | ORAL | 0 refills | Status: AC

## 2024-10-28 NOTE — Progress Notes (Signed)
 Virtual Visit Consent   Lauren Curry, you are scheduled for a virtual visit with a [facility] provider today. Just as with appointments in the office, your consent must be obtained to participate. Your consent will be active for this visit and any virtual visit you may have with one of our providers in the next 365 days. If you have a MyChart account, a copy of this consent can be sent to you electronically.  As this is a virtual visit, video technology does not allow for your provider to perform a traditional examination. This may limit your provider's ability to fully assess your condition. If your provider identifies any concerns that need to be evaluated in person or the need to arrange testing (such as labs, EKG, etc.), we will make arrangements to do so. Although advances in technology are sophisticated, we cannot ensure that it will always work on either your end or our end. If the connection with a video visit is poor, the visit may have to be switched to a telephone visit. With either a video or telephone visit, we are not always able to ensure that we have a secure connection.  By engaging in this virtual visit, you consent to the provision of healthcare and authorize for your insurance to be billed (if applicable) for the services provided during this visit. Depending on your insurance coverage, you may receive a charge related to this service.  I need to obtain your verbal consent now. Are you willing to proceed with your visit today? Lauren Curry has provided verbal consent on 10/28/2024 for a virtual visit (video or telephone). [name], PA-C  Date: 10/28/2024 8:34 AM   Virtual Visit via Video Note   I, [name], connected with  Lauren Curry  ([id number], May 06, 1961) on 10/28/24 at  8:30 AM EST by a video-enabled telemedicine application and verified that I am speaking with the correct person using two identifiers.  Location: Patient: Virtual Visit  Location Patient: Home Provider: Virtual Visit Location Provider: Home Office   I discussed the limitations of evaluation and management by telemedicine and the availability of in person appointments. The patient expressed understanding and agreed to proceed.    History of Present Illness: Lauren Curry is a 63 y.o. who identifies as a female who was assigned female at birth, and is being seen today for dizziness.  HPI: Dizziness This is a new problem. Episode onset: Had Occ therapy yesterday and symptoms started after; Also reports some mild low back pain. Reports last time she had a UTI as well she did get Vertigo on first day of symptoms so questions if that is causing also. The problem occurs daily. Associated symptoms include headaches and vertigo. Pertinent negatives include no chills, congestion, fatigue, myalgias, nausea, numbness, visual change, vomiting or weakness. Associated symptoms comments: Room spinning with position changes and head movements. Subsides if lying still.   PMH: Multiple Sclerosis   Problems:  Patient Active Problem List   Diagnosis Date Noted   Multiple sclerosis    Hypertension    Acute cystitis 07/23/2024    Allergies:  Allergies  Allergen Reactions   Bactrim  [Sulfamethoxazole -Trimethoprim ] Rash   Medications:  Current Outpatient Medications:    cephALEXin  (KEFLEX ) 500 MG capsule, Take 1 capsule (500 mg total) by mouth 2 (two) times daily., Disp: 14 capsule, Rfl: 0   meclizine (ANTIVERT) 25 MG tablet, Take 1 tablet (25 mg total) by mouth 3 (three) times daily as needed for dizziness., Disp: 30 tablet,  Rfl: 0   acetaminophen  (TYLENOL ) 650 MG CR tablet, Take 1,300 mg by mouth daily as needed for pain., Disp: , Rfl:    hydrochlorothiazide  (HYDRODIURIL ) 12.5 MG tablet, Take 12.5 mg by mouth daily., Disp: , Rfl:    KESIMPTA 20 MG/0.4ML SOAJ, Inject 20 mg into the skin every 30 (thirty) days., Disp: , Rfl:    potassium chloride  (KLOR-CON ) 10 MEQ tablet,  Take 2 tablets by mouth every morning., Disp: , Rfl:   Observations/Objective: Patient is well-developed, well-nourished in no acute distress.  Resting comfortably at home.  Head is normocephalic, atraumatic.  No labored breathing. Speech is clear and coherent with logical content.  Patient is alert and oriented at baseline.    Assessment and Plan: 1. Benign paroxysmal positional vertigo, unspecified laterality (Primary) - meclizine (ANTIVERT) 25 MG tablet; Take 1 tablet (25 mg total) by mouth 3 (three) times daily as needed for dizziness.  Dispense: 30 tablet; Refill: 0  2. Suspected UTI - cephALEXin  (KEFLEX ) 500 MG capsule; Take 1 capsule (500 mg total) by mouth 2 (two) times daily.  Dispense: 14 capsule; Refill: 0  - Meclizine added for BPPV - Sent Brandt-Daroff exercises in AVS - Will treat possible UTI empirically with Keflex  - May use AZO for bladder spasms - Continue to push fluids.  - Seek in person evaluation for urine culture if symptoms do not improve or if they worsen.    Follow Up Instructions: I discussed the assessment and treatment plan with the patient. The patient was provided an opportunity to ask questions and all were answered. The patient agreed with the plan and demonstrated an understanding of the instructions.  A copy of instructions were sent to the patient via MyChart unless otherwise noted below.    The patient was advised to call back or seek an in-person evaluation if the symptoms worsen or if the condition fails to improve as anticipated.    [name], PA-C

## 2024-10-28 NOTE — Patient Instructions (Addendum)
 [name], thank you for joining [name], PA-C for today's virtual visit.  While this provider is not your primary care provider (PCP), if your PCP is located in our provider database this encounter information will be shared with them immediately following your visit.   A [facility] MyChart account gives you access to today's visit and all your visits, tests, and labs performed at [facility]  click here if you don't have a [facility] MyChart account or go to [email or web address]  Consent: (Patient) Lauren Curry provided verbal consent for this virtual visit at the beginning of the encounter.  Current Medications:  Current Outpatient Medications:    cephALEXin  (KEFLEX ) 500 MG capsule, Take 1 capsule (500 mg total) by mouth 2 (two) times daily., Disp: 14 capsule, Rfl: 0   meclizine (ANTIVERT) 25 MG tablet, Take 1 tablet (25 mg total) by mouth 3 (three) times daily as needed for dizziness., Disp: 30 tablet, Rfl: 0   acetaminophen  (TYLENOL ) 650 MG CR tablet, Take 1,300 mg by mouth daily as needed for pain., Disp: , Rfl:    hydrochlorothiazide  (HYDRODIURIL ) 12.5 MG tablet, Take 12.5 mg by mouth daily., Disp: , Rfl:    KESIMPTA 20 MG/0.4ML SOAJ, Inject 20 mg into the skin every 30 (thirty) days., Disp: , Rfl:    potassium chloride  (KLOR-CON ) 10 MEQ tablet, Take 2 tablets by mouth every morning., Disp: , Rfl:    Medications ordered in this encounter:  Meds ordered this encounter  Medications   cephALEXin  (KEFLEX ) 500 MG capsule    Sig: Take 1 capsule (500 mg total) by mouth 2 (two) times daily.    Dispense:  14 capsule    Refill:  0    Supervising Provider:   BLAISE ALEENE KIDD [[id number]]   meclizine (ANTIVERT) 25 MG tablet    Sig: Take 1 tablet (25 mg total) by mouth 3 (three) times daily as needed for dizziness.    Dispense:  30 tablet    Refill:  0    Supervising Provider:   LAMPTEY, PHILIP O [[id number]]     *If you need refills on other  medications prior to your next appointment, please contact your pharmacy*  Follow-Up: Call back or seek an in-person evaluation if the symptoms worsen or if the condition fails to improve as anticipated.   Virtual Care [phone]  Other Instructions Benign Positional Vertigo Vertigo is the feeling that you or your surroundings are moving when they are not. Benign positional vertigo is the most common form of vertigo. This is usually a harmless condition (benign). This condition is positional. This means that symptoms are triggered by certain movements and positions. This condition can be dangerous if it occurs while you are doing something that could cause harm to yourself or others. This includes activities such as driving or operating machinery. What are the causes? The inner ear has fluid-filled canals that help your brain sense movement and balance. When the fluid moves, the brain receives messages about your body's position. With benign positional vertigo, calcium crystals in the inner ear break free and disturb the inner ear area. This causes your brain to receive confusing messages about your body's position. What increases the risk? You are more likely to develop this condition if: You are a woman. You are 80 years of age or older. You have recently had a head injury. You have an inner ear disease. What are the signs or symptoms? Symptoms of this condition usually happen when you move  your head or your eyes in different directions. Symptoms may start suddenly and usually last for less than a minute. They include: Loss of balance and falling. Feeling like you are spinning or moving. Feeling like your surroundings are spinning or moving. Nausea and vomiting. Blurred vision. Dizziness. Involuntary eye movement (nystagmus). Symptoms can be mild and cause only minor problems, or they can be severe and interfere with daily life. Episodes of benign positional vertigo may  return (recur) over time. Symptoms may also improve over time. How is this diagnosed? This condition may be diagnosed based on: Your medical history. A physical exam of the head, neck, and ears. Positional tests to check for or stimulate vertigo. You may be asked to turn your head and change positions, such as going from sitting to lying down. A health care provider will watch for symptoms of vertigo. You may be referred to a health care provider who specializes in ear, nose, and throat problems (ENT or otolaryngologist) or a provider who specializes in disorders of the nervous system (neurologist). How is this treated?  This condition may be treated in a session in which your health care provider moves your head in specific positions to help the displaced crystals in your inner ear move. Treatment for this condition may take several sessions. Surgery may be needed in severe cases, but this is rare. In some cases, benign positional vertigo may resolve on its own in 2-4 weeks. Follow these instructions at home: Safety Move slowly. Avoid sudden body or head movements or certain positions, as told by your health care provider. Avoid driving or operating machinery until your health care provider says it is safe. Avoid doing any tasks that would be dangerous to you or others if vertigo occurs. If you have trouble walking or keeping your balance, try using a cane for stability. If you feel dizzy or unstable, sit down right away. Return to your normal activities as told by your health care provider. Ask your health care provider what activities are safe for you. General instructions Take over-the-counter and prescription medicines only as told by your health care provider. Drink enough fluid to keep your urine pale yellow. Keep all follow-up visits. This is important. Contact a health care provider if: You have a fever. Your condition gets worse or you develop new symptoms. Your family or friends  notice any behavioral changes. You have nausea or vomiting that gets worse. You have numbness or a prickling and tingling sensation. Get help right away if you: Have difficulty speaking or moving. Are always dizzy or faint. Develop severe headaches. Have weakness in your legs or arms. Have changes in your hearing or vision. Develop a stiff neck. Develop sensitivity to light. These symptoms may represent a serious problem that is an emergency. Do not wait to see if the symptoms will go away. Get medical help right away. Call your local emergency services (911 in the [place] Do not drive yourself to the hospital. Summary Vertigo is the feeling that you or your surroundings are moving when they are not. Benign positional vertigo is the most common form of vertigo. This condition is caused by calcium crystals in the inner ear that become displaced. This causes a disturbance in an area of the inner ear that helps your brain sense movement and balance. Symptoms include loss of balance and falling, feeling that you or your surroundings are moving, nausea and vomiting, and blurred vision. This condition can be diagnosed based on symptoms, a physical exam,  and positional tests. Follow safety instructions as told by your health care provider and keep all follow-up visits. This is important. This information is not intended to replace advice given to you by your health care provider. Make sure you discuss any questions you have with your health care provider. Document Revised: 06/29/2023 Document Reviewed: 06/29/2023 Elsevier Patient Education  2024 Elsevier Inc.  Brandt-Daroff Exercise for Vertigo  The Brandt-Daroff exercise is one of several exercises that can speed up the compensation process and end the symptoms of vertigo. It often is prescribed for people who have benign paroxysmal positional vertigo (BPPV) and sometimes for labyrinthitis. These exercises won't cure these conditions. But over time  they can reduce symptoms of vertigo.  People who use this exercise usually are told to do several repetitions of the exercise at least twice a day.  How It Is Done To do the Brandt-Daroff exercise:  Start in an upright, seated position. Move into the lying position on one side with your nose pointed up at about a 45-degree angle. Remain in this position for about 30 seconds (or until the vertigo subsides, whichever is longer), then move back to the seated position. Repeat steps 2 and 3 on the other side. What To Expect Symptoms sometimes suddenly go away during an exercise period. More often, improvement occurs gradually over a period of weeks or months.  Why It Is Done The Brandt-Daroff exercise and other similar exercises are used to treat BPPV. These exercises are sometimes used to treat labyrinthitis or vestibular neuritis.  How Well It Works These exercises can help your body get used to the confusing signals that are causing your vertigo. This may help you get over your vertigo sooner.  The Brandt-Daroff exercise does not help relieve the symptoms of benign paroxysmal positional vertigo (BPPV) as well as the Semont maneuver or the Epley maneuver.footnote1  Risks There are no risks in doing these exercises. To avoid hitting your head or developing minor neck injuries, be careful not to lie down too quickly.  References Citations Fife TD, et al. (2008). Practice parameter: Therapies for benign paroxysmal positional vertigo (an evidence-based review). Report of the Quality Standards Subcommittee of the American Academy of Neurology. Neurology, 70(22): [phone]. Current as of: Apr 24, 2021   If you have been instructed to have an in-person evaluation today at a local Urgent Care facility, please use the link below. It will take you to a list of all of our available [facility], including address, phone number and hours of operation. Please do not delay care.  [facility]  If you or a family member do not have a primary care provider, use the link below to schedule a visit and establish care. When you choose a [facility] primary care physician or advanced practice provider, you gain a long-term partner in health. Find a Primary Care Provider  Learn more about [facility]'s in-office and virtual care options: [facility] - Get Care Now
